# Patient Record
Sex: Male | Born: 1965 | Race: Black or African American | Hispanic: No | Marital: Married | Smoking: Never smoker
Health system: Southern US, Community
[De-identification: ages and names within clinical notes are randomized; demographics above are authoritative.]

## PROBLEM LIST (undated history)

## (undated) DIAGNOSIS — I10 Essential (primary) hypertension: Secondary | ICD-10-CM

## (undated) DIAGNOSIS — E119 Type 2 diabetes mellitus without complications: Secondary | ICD-10-CM

## (undated) DIAGNOSIS — M109 Gout, unspecified: Secondary | ICD-10-CM

---

## 2006-05-26 ENCOUNTER — Emergency Department: Payer: Self-pay | Admitting: Emergency Medicine

## 2009-10-31 ENCOUNTER — Ambulatory Visit: Payer: Self-pay | Admitting: Gastroenterology

## 2009-12-03 ENCOUNTER — Observation Stay: Payer: Self-pay | Admitting: Internal Medicine

## 2010-11-23 ENCOUNTER — Emergency Department: Payer: Self-pay | Admitting: Emergency Medicine

## 2011-01-10 ENCOUNTER — Emergency Department: Payer: Self-pay | Admitting: Emergency Medicine

## 2011-07-10 ENCOUNTER — Emergency Department: Payer: Self-pay | Admitting: Unknown Physician Specialty

## 2011-07-10 LAB — COMPREHENSIVE METABOLIC PANEL
Albumin: 3.5 g/dL (ref 3.4–5.0)
BUN: 12 mg/dL (ref 7–18)
Bilirubin,Total: 0.3 mg/dL (ref 0.2–1.0)
Calcium, Total: 9.3 mg/dL (ref 8.5–10.1)
Co2: 26 mmol/L (ref 21–32)
EGFR (African American): >60
Glucose: 211 mg/dL — ABNORMAL HIGH (ref 65–99)
Osmolality: 282 (ref 275–301)
Potassium: 3.8 mmol/L (ref 3.5–5.1)
SGOT(AST): 28 U/L (ref 15–37)
SGPT (ALT): 28 U/L
Sodium: 138 mmol/L (ref 136–145)
Total Protein: 8.3 g/dL — ABNORMAL HIGH (ref 6.4–8.2)

## 2011-07-10 LAB — SEDIMENTATION RATE: Erythrocyte Sed Rate: 44 mm/hr — ABNORMAL HIGH (ref 0–15)

## 2011-07-10 LAB — CBC
MCH: 29.5 pg (ref 26.0–34.0)
MCHC: 34 g/dL (ref 32.0–36.0)
MCV: 87 fL (ref 80–100)
Platelet: 255 10*3/uL (ref 150–440)
WBC: 5.9 10*3/uL (ref 3.8–10.6)

## 2011-09-05 ENCOUNTER — Ambulatory Visit: Payer: Self-pay

## 2015-07-07 ENCOUNTER — Encounter: Payer: Self-pay | Admitting: Emergency Medicine

## 2015-07-07 ENCOUNTER — Emergency Department
Admission: EM | Admit: 2015-07-07 | Discharge: 2015-07-07 | Disposition: A | Discharge status: Home / Self Care | Payer: 59 | Attending: Emergency Medicine | Admitting: Emergency Medicine

## 2015-07-07 ENCOUNTER — Emergency Department: Payer: 59

## 2015-07-07 DIAGNOSIS — R42 Dizziness and giddiness: Secondary | ICD-10-CM | POA: Insufficient documentation

## 2015-07-07 DIAGNOSIS — Z7982 Long term (current) use of aspirin: Secondary | ICD-10-CM | POA: Diagnosis not present

## 2015-07-07 DIAGNOSIS — E119 Type 2 diabetes mellitus without complications: Secondary | ICD-10-CM | POA: Diagnosis not present

## 2015-07-07 DIAGNOSIS — I10 Essential (primary) hypertension: Secondary | ICD-10-CM | POA: Diagnosis not present

## 2015-07-07 DIAGNOSIS — Z79899 Other long term (current) drug therapy: Secondary | ICD-10-CM | POA: Insufficient documentation

## 2015-07-07 HISTORY — DX: Essential (primary) hypertension: I10

## 2015-07-07 HISTORY — DX: Gout, unspecified: M10.9

## 2015-07-07 HISTORY — DX: Type 2 diabetes mellitus without complications: E11.9

## 2015-07-07 LAB — URINALYSIS COMPLETE WITH MICROSCOPIC (ARMC ONLY)
Bacteria, UA: NONE SEEN
Bilirubin Urine: NEGATIVE
GLUCOSE, UA: NEGATIVE mg/dL
Hgb urine dipstick: NEGATIVE
KETONES UR: NEGATIVE mg/dL
Leukocytes, UA: NEGATIVE
NITRITE: NEGATIVE
PROTEIN: NEGATIVE mg/dL
RBC / HPF: NONE SEEN RBC/hpf (ref 0–5)
SPECIFIC GRAVITY, URINE: 1.01 (ref 1.005–1.030)
Squamous Epithelial / LPF: NONE SEEN
pH: 7 (ref 5.0–8.0)

## 2015-07-07 LAB — CBC
HCT: 36.5 % — ABNORMAL LOW (ref 40.0–52.0)
HEMOGLOBIN: 12.8 g/dL — AB (ref 13.0–18.0)
MCH: 29.8 pg (ref 26.0–34.0)
MCHC: 35 g/dL (ref 32.0–36.0)
MCV: 84.9 fL (ref 80.0–100.0)
Platelets: 272 10*3/uL (ref 150–440)
RBC: 4.3 MIL/uL — AB (ref 4.40–5.90)
RDW: 14.6 % — ABNORMAL HIGH (ref 11.5–14.5)
WBC: 6.8 10*3/uL (ref 3.8–10.6)

## 2015-07-07 LAB — BASIC METABOLIC PANEL
ANION GAP: 11 (ref 5–15)
BUN: 14 mg/dL (ref 6–20)
CHLORIDE: 101 mmol/L (ref 101–111)
CO2: 25 mmol/L (ref 22–32)
CREATININE: 0.85 mg/dL (ref 0.61–1.24)
Calcium: 9.2 mg/dL (ref 8.9–10.3)
GFR calc non Af Amer: >60 mL/min (ref >60)
Glucose, Bld: 147 mg/dL — ABNORMAL HIGH (ref 65–99)
Potassium: 3.6 mmol/L (ref 3.5–5.1)
SODIUM: 137 mmol/L (ref 135–145)

## 2015-07-07 LAB — TROPONIN I: Troponin I: <0.03 ng/mL (ref <0.031)

## 2015-07-07 MED ORDER — SODIUM CHLORIDE 0.9 % IV BOLUS (SEPSIS)
1000.0000 mL | Freq: Once | INTRAVENOUS | Status: AC
  Administered 2015-07-07: 1000 mL via INTRAVENOUS

## 2015-07-07 MED ORDER — MECLIZINE HCL 25 MG PO TABS: 25.0000 mg | ORAL_TABLET | Freq: Three times a day (TID) | ORAL | Status: DC | PRN

## 2015-07-07 NOTE — ED Notes (Signed)
Resumed care from Judson Roch, [place]. Pt resting comfortably with spouse at bedside. NAD noted.

## 2015-07-07 NOTE — ED Notes (Signed)
Pt discharged home after verbalizing understanding of discharge instructions; nad noted. 

## 2015-07-07 NOTE — ED Notes (Addendum)
Pt comes in c/o dizziness, reports that this started yesterday morning. Reports that he got up to get ready for work, went into the bathroom to have a BM, had a normal BM without having to strain a lot, then got up and felt like the room was spinning around him, was stumbling, then went back to bed for a few hours and got up and felt better. This morning he got up and again felt like the world was spinning around him and came in due to these symptoms. Denies any N/V/D, denies chest pain, SOB, headache, blurred vision, abd pain. Denies any recent illness or any recent seasonal allergy problems.

## 2015-07-07 NOTE — Discharge Instructions (Signed)

## 2015-07-07 NOTE — ED Notes (Signed)
Pt given urinal and told that we need a urine sample when he has to pee. Pt verbalized understanding.

## 2015-07-07 NOTE — ED Provider Notes (Signed)
[facility] Emergency Department Provider Note   ____________________________________________  Time seen: Approximately 6:04 AM  I have reviewed the triage vital signs and the nursing notes.   HISTORY  Chief Complaint Dizziness    HPI Steven Meyers is a 50 y.o. male who comes into the hospital today with dizziness. The patient reports that he feels as though the room is spinning. He has been losing his balance as well with the dizziness. The patient reports that the symptoms started yesterday morning around 5:30. The patient was waking up to get ready for work and started feeling the room get dizzy. He denies any headache or blurred vision. He was laid back in the bed for a couple of hours and reports he woke back up and continued about his day. The patient did not have any more episodes of the symptoms during the day yesterday. He reports that when he woke up this morning he went to the bathroom and was doing his typical routine when the dizziness returned. He again denies any headache but reports that his symptoms are improved at this time. The patient had no chest pain or shortness of breath no abdominal pain no nausea or vomiting no weakness or numbness. He is here for evaluation of his dizziness.   Past Medical History  Diagnosis Date  . Diabetes mellitus without complication ([place])   . Hypertension   . Gout     There are no active problems to display for this patient.   History reviewed. No pertinent past surgical history.  Current Outpatient Rx  Name  Route  Sig  Dispense  Refill  . amLODipine (NORVASC) 10 MG tablet   Oral   Take 10 mg by mouth daily.         . colchicine 0.6 MG tablet   Oral   Take 0.6 mg by mouth daily as needed.         . fenofibrate (TRICOR) 48 MG tablet   Oral   Take by mouth daily.         Marland Kitchen glimepiride (AMARYL) 2 MG tablet   Oral   Take 2 mg by mouth daily with breakfast.         . hydrALAZINE  (APRESOLINE) 10 MG tablet               . triamterene-hydrochlorothiazide (DYAZIDE) 37.5-25 MG capsule   Oral   Take 1 capsule by mouth daily.           Allergies Ace inhibitors  History reviewed. No pertinent family history.  Social History Social History  Substance Use Topics  . Smoking status: Never Smoker   . Smokeless tobacco: None  . Alcohol Use: No    Review of Systems Constitutional: No fever/chills Eyes: No visual changes. ENT: No sore throat. Cardiovascular: Denies chest pain. Respiratory: Denies shortness of breath. Gastrointestinal: No abdominal pain.  No nausea, no vomiting.  No diarrhea.  No constipation. Genitourinary: Negative for dysuria. Musculoskeletal: Negative for back pain. Skin: Negative for rash. Neurological: dizziness  10-point ROS otherwise negative.  ____________________________________________   PHYSICAL EXAM:  VITAL SIGNS: ED Triage Vitals  Enc Vitals Group     BP 07/07/15 0552 137/97 mmHg     Pulse Rate 07/07/15 0552 106     Resp 07/07/15 0552 20     Temp 07/07/15 0552 97.5 F (36.4 C)     Temp Source 07/07/15 0552 Oral     SpO2 07/07/15 0552 98 %  Weight 07/07/15 0552 210 lb (95.255 kg)     Height 07/07/15 0552 6\' 1"  (1.854 m)     Head Cir --      Peak Flow --      Pain Score --      Pain Loc --      Pain Edu? --      Excl. in [place]? --     Constitutional: Alert and oriented. Well appearing and in no acute distress. Eyes: Conjunctivae are normal. PERRL. EOMI. Head: Atraumatic. Nose: No congestion/rhinnorhea. Mouth/Throat: Mucous membranes are moist.  Oropharynx non-erythematous. Cardiovascular: Normal rate, regular rhythm. Grossly normal heart sounds.  Good peripheral circulation. Respiratory: Normal respiratory effort.  No retractions. Lungs CTAB. Gastrointestinal: Soft and nontender. No distention. Positive bowel sounds Musculoskeletal: No lower extremity tenderness nor edema.   Neurologic:  Normal speech and  language. No gross focal neurologic deficits are appreciated. Cranial nerves II through XII are grossly intact with no focal motor or neuro deficits Skin:  Skin is warm, dry and intact. Psychiatric: Mood and affect are normal.   ____________________________________________   LABS (all labs ordered are listed, but only abnormal results are displayed)  Labs Reviewed  CBC - Abnormal; Notable for the following:    RBC 4.30 (*)    Hemoglobin 12.8 (*)    HCT 36.5 (*)    RDW 14.6 (*)    All other components within normal limits  BASIC METABOLIC PANEL - Abnormal; Notable for the following:    Glucose, Bld 147 (*)    All other components within normal limits  TROPONIN I  URINALYSIS COMPLETEWITH MICROSCOPIC ([facility] ONLY)   ____________________________________________  EKG  ED ECG REPORT I, Loney Hering, the attending physician, personally viewed and interpreted this ECG.   Date: 07/07/2015  EKG Time: 602  Rate: 93  Rhythm: normal sinus rhythm  Axis: normal  Intervals:none  ST&T Change: none  ____________________________________________  RADIOLOGY  CT head: No acute intracranial abnormality ____________________________________________   PROCEDURES  Procedure(s) performed: None  Critical Care performed: No  ____________________________________________   INITIAL IMPRESSION / ASSESSMENT AND PLAN / ED COURSE  Pertinent labs & imaging results that were available during my care of the patient were reviewed by me and considered in my medical decision making (see chart for details).  This is a 50 year old male who comes into the hospital today with some dizziness. The patient reports that this started suddenly yesterday as he was getting dressed. He reports though that the pain didn't subside but came back. The patient is here for evaluation.  The patient does develop some mild tachycardia with orthostatic vital signs. I will give him a liter of normal saline. The  patient's CT head is also unremarkable. The patient's ears do not show any significant fluid. The patient has not had any viral illness to explain his symptoms. The patient will be signed out to Dr. Corky Downs who will follow-up the results of the head CT and disposition the patient. ____________________________________________   FINAL CLINICAL IMPRESSION(S) / ED DIAGNOSES  Final diagnoses:  Dizziness      NEW MEDICATIONS STARTED DURING THIS VISIT:  New Prescriptions   No medications on file     Note:  This document was prepared using Dragon voice recognition software and may include unintentional dictation errors.    Loney Hering, MD 07/07/15 [phone]

## 2015-07-07 NOTE — ED Notes (Signed)
Pt in with co dizziness since yest no hx of the same. Pt denies any pain or shob or recent illness.

## 2015-07-07 NOTE — ED Notes (Signed)
Pt taken to CT via stretcher by radiology staff

## 2015-07-07 NOTE — ED Notes (Signed)
Pt given meal tray; ok per dr. Corky Downs

## 2015-07-07 NOTE — ED Provider Notes (Signed)
Second troponin is normal. CT head is unremarkable. Patient feels well. 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

## 2016-05-30 ENCOUNTER — Encounter: Payer: Self-pay | Admitting: *Deleted

## 2016-05-30 ENCOUNTER — Emergency Department
Admission: EM | Admit: 2016-05-30 | Discharge: 2016-05-30 | Disposition: A | Discharge status: Home / Self Care | Payer: 59 | Attending: Emergency Medicine | Admitting: Emergency Medicine

## 2016-05-30 DIAGNOSIS — X500XXA Overexertion from strenuous movement or load, initial encounter: Secondary | ICD-10-CM | POA: Insufficient documentation

## 2016-05-30 DIAGNOSIS — Z7982 Long term (current) use of aspirin: Secondary | ICD-10-CM | POA: Insufficient documentation

## 2016-05-30 DIAGNOSIS — Y9389 Activity, other specified: Secondary | ICD-10-CM | POA: Insufficient documentation

## 2016-05-30 DIAGNOSIS — R030 Elevated blood-pressure reading, without diagnosis of hypertension: Secondary | ICD-10-CM

## 2016-05-30 DIAGNOSIS — Z7984 Long term (current) use of oral hypoglycemic drugs: Secondary | ICD-10-CM | POA: Insufficient documentation

## 2016-05-30 DIAGNOSIS — Y999 Unspecified external cause status: Secondary | ICD-10-CM | POA: Insufficient documentation

## 2016-05-30 DIAGNOSIS — I10 Essential (primary) hypertension: Secondary | ICD-10-CM | POA: Insufficient documentation

## 2016-05-30 DIAGNOSIS — E119 Type 2 diabetes mellitus without complications: Secondary | ICD-10-CM | POA: Insufficient documentation

## 2016-05-30 DIAGNOSIS — Y929 Unspecified place or not applicable: Secondary | ICD-10-CM | POA: Insufficient documentation

## 2016-05-30 DIAGNOSIS — S39012A Strain of muscle, fascia and tendon of lower back, initial encounter: Secondary | ICD-10-CM | POA: Insufficient documentation

## 2016-05-30 MED ORDER — DIAZEPAM 5 MG PO TABS
5.0000 mg | ORAL_TABLET | Freq: Once | ORAL | Status: AC
  Administered 2016-05-30: 5 mg via ORAL
  Filled 2016-05-30: qty 1

## 2016-05-30 MED ORDER — KETOROLAC TROMETHAMINE 30 MG/ML IJ SOLN
30.0000 mg | Freq: Once | INTRAMUSCULAR | Status: AC
  Administered 2016-05-30: 30 mg via INTRAMUSCULAR
  Filled 2016-05-30: qty 1

## 2016-05-30 MED ORDER — NAPROXEN 500 MG PO TABS: 500.0000 mg | ORAL_TABLET | Freq: Two times a day (BID) | ORAL | 0 refills | Status: DC

## 2016-05-30 MED ORDER — HYDROCODONE-ACETAMINOPHEN 5-325 MG PO TABS: 1.0000 | ORAL_TABLET | ORAL | 0 refills | Status: DC | PRN

## 2016-05-30 MED ORDER — DIAZEPAM 2 MG PO TABS: 2.0000 mg | ORAL_TABLET | Freq: Three times a day (TID) | ORAL | 0 refills | Status: DC | PRN

## 2016-05-30 MED ORDER — HYDROCODONE-ACETAMINOPHEN 5-325 MG PO TABS
2.0000 | ORAL_TABLET | Freq: Once | ORAL | Status: AC
  Administered 2016-05-30: 2 via ORAL
  Filled 2016-05-30: qty 2

## 2016-05-30 NOTE — ED Triage Notes (Signed)
Pt complains of low back pain starting 2 days, pt reports pain in left buttock, pt denies any other symptoms

## 2016-05-30 NOTE — ED Notes (Signed)
See triage note. Developed pain to left lower back about 3 days ago  States pain is non radiating  Min relief with OTC Aleve   States he developed pain after lifting a bird bath  ambulates with limp and using crutches

## 2016-05-30 NOTE — Discharge Instructions (Signed)
Follow-up with her primary care doctor if any continued problems. Continue taking medication as directed. Valium as needed for muscle spasms 3 times a day and Norco as needed for pain. These medications could cause drowsiness therefore you cannot operate machinery or drive. Naproxen 500 mg twice a day with food. This medication is for inflammation. You can drive while taking naproxen. Use ice or heat to your back as needed for comfort.

## 2016-05-30 NOTE — ED Provider Notes (Signed)
[facility] Emergency Department Provider Note   ____________________________________________   First MD Initiated Contact with Patient 05/30/16 [phone]     (approximate)  I have reviewed the triage vital signs and the nursing notes.   HISTORY  Chief Complaint Back Pain    HPI Steven Meyers is a 51 y.o. male is here with complaint of pain left buttocks.  Patient states approximately 2 days ago he lifted a bird bath and began having pain immediately. He's been taking Aleve over-the-counter with some relief of his pain. Patient states that the pain is nonradiating. He denies any urinary symptoms. Patient has been using crutches to help support him while walking and continues to have a limp due to his pain. Patient denies any prior back problems. 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 Currently he rates his pain as 8 out of 10. His last Aleve was last night.   Past Medical History:  Diagnosis Date  . Diabetes mellitus without complication ([place])   . Gout   . Hypertension     There are no active problems to display for this patient.   History reviewed. No pertinent surgical history.  Prior to Admission medications   Medication Sig Start Date End Date Taking? Authorizing Provider  amLODipine (NORVASC) 10 MG tablet Take 10 mg by mouth daily.    Historical Provider, MD  aspirin 81 MG chewable tablet Chew 81 mg by mouth daily.    Historical Provider, MD  colchicine 0.6 MG tablet Take 0.6 mg by mouth daily as needed.    Historical Provider, MD  diazepam (VALIUM) 2 MG tablet Take 1 tablet (2 mg total) by mouth every 8 (eight) hours as needed for muscle spasms. 05/30/16   Johnn Hai, PA-C  glimepiride (AMARYL) 2 MG tablet Take 2 mg by mouth daily with breakfast.    Historical Provider, MD  hydrALAZINE (APRESOLINE) 10 MG tablet Take 10 mg by mouth 2 (two) times daily.     Historical Provider, MD    HYDROcodone-acetaminophen (NORCO/VICODIN) 5-325 MG tablet Take 1 tablet by mouth every 4 (four) hours as needed for moderate pain. 05/30/16   Johnn Hai, PA-C  naproxen (NAPROSYN) 500 MG tablet Take 1 tablet (500 mg total) by mouth 2 (two) times daily with a meal. 05/30/16   Johnn Hai, PA-C  triamterene-hydrochlorothiazide (DYAZIDE) 37.5-25 MG capsule Take 1 capsule by mouth daily.    Historical Provider, MD    Allergies Ace inhibitors  No family history on file.  Social History Social History  Substance Use Topics  . Smoking status: Never Smoker  . Smokeless tobacco: Not on file  . Alcohol use No    Review of Systems  Constitutional: No fever/chills Cardiovascular: Denies chest pain. Respiratory: Denies shortness of breath. Gastrointestinal: No abdominal pain.  No nausea, no vomiting.  Genitourinary: Negative for dysuria. Musculoskeletal: Positive for back pain. Positive left buttocks pain. Neurological: Negative for headaches, focal weakness or numbness.   ____________________________________________   PHYSICAL EXAM:  VITAL SIGNS: ED Triage Vitals  Enc Vitals Group     BP 05/30/16 0730 (!) 162/103     Pulse Rate 05/30/16 0730 100     Resp 05/30/16 0730 16     Temp 05/30/16 0730 97.8 F (36.6 C)     Temp Source 05/30/16 0730 Oral     SpO2 05/30/16 0730 99 %     Weight 05/30/16 0731 215 lb (97.5 kg)  Height 05/30/16 0731 6\' 2"  (1.88 m)     Head Circumference --      Peak Flow --      Pain Score 05/30/16 0724 8     Pain Loc --      Pain Edu? --      Excl. in [place]? --     Constitutional: Alert and oriented. Well appearing and in no acute distress. Eyes: Conjunctivae are normal. PERRL. EOMI. Head: Atraumatic. Nose: No congestion/rhinnorhea. Neck: No stridor.   Cardiovascular: Normal rate, regular rhythm. Grossly normal heart sounds.  Good peripheral circulation. Respiratory: Normal respiratory effort.  No retractions. Lungs  CTAB. Gastrointestinal: Soft and nontender. No distention.  No CVA tenderness. Musculoskeletal: Examination back there is no gross deformity noted. There is tenderness on palpation of the left sacral area along with the left lumbar paravertebral muscles. Range of motion is restricted secondary to discomfort. No active muscle spasms were seen. There is moderate soft tissue tenderness on palpation. Straight leg raises were negative at 90. Neurologic:  Normal speech and language. No gross focal neurologic deficits are appreciated.  Skin:  Skin is warm, dry and intact. No rash noted. Psychiatric: Mood and affect are normal. Speech and behavior are normal.  ____________________________________________   LABS (all labs ordered are listed, but only abnormal results are displayed)  Labs Reviewed - No data to display _  PROCEDURES  Procedure(s) performed: None  Procedures  Critical Care performed: No  ____________________________________________   INITIAL IMPRESSION / ASSESSMENT AND PLAN / ED COURSE  Pertinent labs & imaging results that were available during my care of the patient were reviewed by me and considered in my medical decision making (see chart for details).  Patient improved with medication given in the department. Patient was given Norco 2 tablets by mouth along with Valium 5 mg. He was also given Toradol 30 mg injection. Patient is able to move with less pain and states that he feels much better. We discussed continuing his medications for the next several days. He is also encouraged to use ice or heat to his back. Patient will follow up with his PCP at [facility] if any continued problems and also for recheck of his blood pressure.      ____________________________________________   FINAL CLINICAL IMPRESSION(S) / ED DIAGNOSES  Final diagnoses:  Strain of lumbar paraspinal muscle, initial encounter  Elevated blood pressure reading      NEW MEDICATIONS  STARTED DURING THIS VISIT:  Discharge Medication List as of 05/30/2016  9:07 AM    START taking these medications   Details  diazepam (VALIUM) 2 MG tablet Take 1 tablet (2 mg total) by mouth every 8 (eight) hours as needed for muscle spasms., Starting Wed 05/30/2016, Print    HYDROcodone-acetaminophen (NORCO/VICODIN) 5-325 MG tablet Take 1 tablet by mouth every 4 (four) hours as needed for moderate pain., Starting Wed 05/30/2016, Print    naproxen (NAPROSYN) 500 MG tablet Take 1 tablet (500 mg total) by mouth 2 (two) times daily with a meal., Starting Wed 05/30/2016, Print         Note:  This document was prepared using Dragon voice recognition software and may include unintentional dictation errors.    Johnn Hai, PA-C 05/30/16 [place], MD 06/04/16 [phone]

## 2016-05-30 NOTE — ED Notes (Signed)
Pt given ice water. Ok'ed by Suanne Marker, [place]

## 2016-10-27 ENCOUNTER — Emergency Department
Admission: EM | Admit: 2016-10-27 | Discharge: 2016-10-27 | Disposition: A | Discharge status: Home / Self Care | Payer: 59 | Attending: Emergency Medicine | Admitting: Emergency Medicine

## 2016-10-27 ENCOUNTER — Encounter: Payer: Self-pay | Admitting: Medical Oncology

## 2016-10-27 ENCOUNTER — Emergency Department: Payer: 59

## 2016-10-27 DIAGNOSIS — Z7982 Long term (current) use of aspirin: Secondary | ICD-10-CM | POA: Insufficient documentation

## 2016-10-27 DIAGNOSIS — Z79899 Other long term (current) drug therapy: Secondary | ICD-10-CM | POA: Insufficient documentation

## 2016-10-27 DIAGNOSIS — M25461 Effusion, right knee: Secondary | ICD-10-CM | POA: Insufficient documentation

## 2016-10-27 DIAGNOSIS — I10 Essential (primary) hypertension: Secondary | ICD-10-CM | POA: Insufficient documentation

## 2016-10-27 DIAGNOSIS — M1711 Unilateral primary osteoarthritis, right knee: Secondary | ICD-10-CM

## 2016-10-27 MED ORDER — KETOROLAC TROMETHAMINE 30 MG/ML IJ SOLN
30.0000 mg | Freq: Once | INTRAMUSCULAR | Status: AC
  Administered 2016-10-27: 30 mg via INTRAVENOUS
  Filled 2016-10-27: qty 1

## 2016-10-27 MED ORDER — HYDROCODONE-ACETAMINOPHEN 5-325 MG PO TABS: 1.0000 | ORAL_TABLET | Freq: Four times a day (QID) | ORAL | 0 refills | Status: DC | PRN

## 2016-10-27 MED ORDER — NAPROXEN 500 MG PO TABS: 500.0000 mg | ORAL_TABLET | Freq: Two times a day (BID) | ORAL | 0 refills | Status: AC

## 2016-10-27 NOTE — ED Triage Notes (Signed)
Rt knee pain with hx of same, had drainage removed from knee 5 months ago. No injury.

## 2016-10-27 NOTE — ED Provider Notes (Signed)
[facility] Emergency Department Provider Note  ____________________________________________  Time seen: Approximately 11:19 AM  I have reviewed the triage vital signs and the nursing notes.   HISTORY  Chief Complaint Knee Pain    HPI Steven Meyers is a 51 y.o. male that presents to the emergency department for evaluation of right knee pain on and off for months. Knee is painful to move but is not red or hot to touch. No trauma. Current pain began about 3 days ago. He saw his primary care provider, who prescribed him prednisone. He went to urgent care this morning and was told to come to the emergency room to have fluid drained off of his knee. He has had fluid drained off his knee before.He has a history of gout and takes colchicine. No fever, shortness breath, chest pain, nausea, vomiting, abdominal pain, calf pain, numbness, tingling.   Past Medical History:  Diagnosis Date  . Diabetes mellitus without complication ([place])   . Gout   . Hypertension     There are no active problems to display for this patient.   No past surgical history on file.  Prior to Admission medications   Medication Sig Start Date End Date Taking? Authorizing Provider  amLODipine (NORVASC) 10 MG tablet Take 10 mg by mouth daily.    [provider]  aspirin 81 MG chewable tablet Chew 81 mg by mouth daily.    [provider]  colchicine 0.6 MG tablet Take 0.6 mg by mouth daily as needed.    [provider]  diazepam (VALIUM) 2 MG tablet Take 1 tablet (2 mg total) by mouth every 8 (eight) hours as needed for muscle spasms. 05/30/16   Johnn Hai, PA-C  glimepiride (AMARYL) 2 MG tablet Take 2 mg by mouth daily with breakfast.    [provider]  hydrALAZINE (APRESOLINE) 10 MG tablet Take 10 mg by mouth 2 (two) times daily.     [provider]  HYDROcodone-acetaminophen (NORCO/VICODIN) 5-325 MG tablet Take 1 tablet by mouth every  6 (six) hours as needed for moderate pain. 10/27/16   Laban Emperor, PA-C  naproxen (NAPROSYN) 500 MG tablet Take 1 tablet (500 mg total) by mouth 2 (two) times daily with a meal. 10/27/16 10/27/17  Laban Emperor, PA-C  triamterene-hydrochlorothiazide (DYAZIDE) 37.5-25 MG capsule Take 1 capsule by mouth daily.    [provider]    Allergies Ace inhibitors  No family history on file.  Social History Social History  Substance Use Topics  . Smoking status: Never Smoker  . Smokeless tobacco: Not on file  . Alcohol use No     Review of Systems  Constitutional: No fever/chills Cardiovascular: No chest pain. Respiratory: No SOB. Gastrointestinal: No abdominal pain.  No nausea, no vomiting.  Musculoskeletal: Positive for knee pain. Skin: Negative for rash, abrasions, lacerations, ecchymosis. Neurological: Negative for numbness or tingling   ____________________________________________   PHYSICAL EXAM:  VITAL SIGNS: ED Triage Vitals  Enc Vitals Group     BP 10/27/16 1044 (!) 145/99     Pulse Rate 10/27/16 1044 67     Resp 10/27/16 1044 18     Temp 10/27/16 1044 (!) 97.3 F (36.3 C)     Temp Source 10/27/16 1044 Oral     SpO2 10/27/16 1044 99 %     Weight 10/27/16 1045 210 lb (95.3 kg)     Height 10/27/16 1045 6\' 2"  (1.88 m)     Head Circumference --  Peak Flow --      Pain Score 10/27/16 1044 8     Pain Loc --      Pain Edu? --      Excl. in [place]? --      Constitutional: Alert and oriented. Well appearing and in no acute distress. Eyes: Conjunctivae are normal. PERRL. EOMI. Head: Atraumatic. ENT:      Ears:      Nose: No congestion/rhinnorhea.      Mouth/Throat: Mucous membranes are moist.  Neck: No stridor.  Cardiovascular: Normal rate, regular rhythm.  Good peripheral circulation. Palpable dorsalis pedis pulses. Respiratory: Normal respiratory effort without tachypnea or retractions. Lungs CTAB. Good air entry to the bases with no decreased or  absent breath sounds. Musculoskeletal: Full range of motion to all extremities. No gross deformities appreciated. Tenderness to palpation in the popliteal fossa. No calf tenderness to palpation. No erythema or visible swelling. Neurologic:  Normal speech and language. No gross focal neurologic deficits are appreciated.  Skin:  Skin is warm, dry and intact. No rash noted.   ____________________________________________   LABS (all labs ordered are listed, but only abnormal results are displayed)  Labs Reviewed - No data to display ____________________________________________  EKG   ____________________________________________  RADIOLOGY Robinette Haines, personally viewed and evaluated these images (plain radiographs) as part of my medical decision making, as well as reviewing the written report by the radiologist.  Dg Knee Complete 4 Views Right  Result Date: 10/27/2016 CLINICAL DATA:  Diffuse right knee pain for 1 week, worse today. EXAM: RIGHT KNEE - COMPLETE 4+ VIEW COMPARISON:  None. FINDINGS: Spurring along the medial knee compartment. There may be a small amount of suprapatellar joint fluid. Mild degenerative changes at patellofemoral compartment. Negative for fracture or dislocation. IMPRESSION: No acute abnormality to the right knee. Mild degenerative changes.  Question a small amount of joint fluid. Electronically Signed   By: Markus Daft M.D.   On: 10/27/2016 11:18    ____________________________________________    PROCEDURES  Procedure(s) performed:    Procedures    Medications  ketorolac (TORADOL) 30 MG/ML injection 30 mg (30 mg Intravenous Given 10/27/16 1221)     ____________________________________________   INITIAL IMPRESSION / ASSESSMENT AND PLAN / ED COURSE  Pertinent labs & imaging results that were available during my care of the patient were reviewed by me and considered in my medical decision making (see chart for details).  Review of the [place] CSRS  was performed in accordance of the [place] prior to dispensing any controlled drugs.    Patient presented to emergency department for evaluation of knee pain on and off for months. X-ray consistent with osteoarthritis and a possible small amount of joint fluid. Toradol shot was given. Patient is currently taking prednisone. Patient will be discharged home with prescriptions for a short course of Vicodin and naproxen. Patient is to follow up with orthopedics as directed. Patient is given ED precautions to return to the ED for any worsening or new symptoms.     ____________________________________________  FINAL CLINICAL IMPRESSION(S) / ED DIAGNOSES  Final diagnoses:  Osteoarthritis of right knee, unspecified osteoarthritis type  Fluid in right knee joint      NEW MEDICATIONS STARTED DURING THIS VISIT:  Discharge Medication List as of 10/27/2016 12:32 PM          This chart was dictated using voice recognition software/Dragon. Despite best efforts to proofread, errors can occur which can change the meaning. Any change was purely unintentional.  Laban Emperor, PA-C 10/27/16 1642    Lisa Roca, MD 10/28/16 [phone]

## 2017-01-31 ENCOUNTER — Encounter: Payer: Self-pay | Admitting: Emergency Medicine

## 2017-01-31 ENCOUNTER — Emergency Department
Admission: EM | Admit: 2017-01-31 | Discharge: 2017-01-31 | Disposition: A | Discharge status: Home / Self Care | Payer: 59 | Attending: Emergency Medicine | Admitting: Emergency Medicine

## 2017-01-31 ENCOUNTER — Other Ambulatory Visit: Payer: Self-pay

## 2017-01-31 DIAGNOSIS — E119 Type 2 diabetes mellitus without complications: Secondary | ICD-10-CM | POA: Insufficient documentation

## 2017-01-31 DIAGNOSIS — K122 Cellulitis and abscess of mouth: Secondary | ICD-10-CM | POA: Insufficient documentation

## 2017-01-31 DIAGNOSIS — Z7982 Long term (current) use of aspirin: Secondary | ICD-10-CM | POA: Insufficient documentation

## 2017-01-31 DIAGNOSIS — Z79899 Other long term (current) drug therapy: Secondary | ICD-10-CM | POA: Insufficient documentation

## 2017-01-31 LAB — GROUP A STREP BY PCR: GROUP A STREP BY PCR: DETECTED — AB

## 2017-01-31 MED ORDER — METHYLPREDNISOLONE 4 MG PO TBPK: ORAL_TABLET | ORAL | 0 refills | Status: DC

## 2017-01-31 MED ORDER — AMOXICILLIN 875 MG PO TABS: 875.0000 mg | ORAL_TABLET | Freq: Two times a day (BID) | ORAL | 0 refills | Status: DC

## 2017-01-31 MED ORDER — MAGIC MOUTHWASH W/LIDOCAINE: 5.0000 mL | Freq: Four times a day (QID) | ORAL | 0 refills | Status: DC

## 2017-01-31 NOTE — ED Provider Notes (Signed)
[facility] Emergency Department Provider Note   ____________________________________________   First MD Initiated Contact with Patient 01/31/17 0719     (approximate)  I have reviewed the triage vital signs and the nursing notes.   HISTORY  Chief Complaint Sore Throat    HPI Steven Meyers is a 51 y.o. male patient complaining of sore throat that began last night. Patient awakened this morning with over-the-counter swollen. Patient denies URI signs and symptoms. Patient is able to tolerate fluids. Patient did not try solids since onset of complaint.Patient rates his pain as 6/10. Patient described his pain as "sore". No palliative measures for complaint   Past Medical History:  Diagnosis Date  . Diabetes mellitus without complication ([place])   . Gout   . Hypertension     There are no active problems to display for this patient.   History reviewed. No pertinent surgical history.  Prior to Admission medications   Medication Sig Start Date End Date Taking? Authorizing Provider  amLODipine (NORVASC) 10 MG tablet Take 10 mg by mouth daily.    [provider]  amoxicillin (AMOXIL) 875 MG tablet Take 1 tablet (875 mg total) by mouth 2 (two) times daily. 01/31/17   Sable Feil, PA-C  aspirin 81 MG chewable tablet Chew 81 mg by mouth daily.    [provider]  colchicine 0.6 MG tablet Take 0.6 mg by mouth daily as needed.    [provider]  diazepam (VALIUM) 2 MG tablet Take 1 tablet (2 mg total) by mouth every 8 (eight) hours as needed for muscle spasms. 05/30/16   Johnn Hai, PA-C  glimepiride (AMARYL) 2 MG tablet Take 2 mg by mouth daily with breakfast.    [provider]  hydrALAZINE (APRESOLINE) 10 MG tablet Take 10 mg by mouth 2 (two) times daily.     [provider]  HYDROcodone-acetaminophen (NORCO/VICODIN) 5-325 MG tablet Take 1 tablet by mouth every 6 (six) hours as needed for moderate  pain. 10/27/16   Laban Emperor, PA-C  magic mouthwash w/lidocaine SOLN Take 5 mLs by mouth 4 (four) times daily. For swish and swallow 01/31/17   Sable Feil, PA-C  methylPREDNISolone (MEDROL DOSEPAK) 4 MG TBPK tablet Take Tapered dose as directed 01/31/17   Sable Feil, PA-C  naproxen (NAPROSYN) 500 MG tablet Take 1 tablet (500 mg total) by mouth 2 (two) times daily with a meal. 10/27/16 10/27/17  Laban Emperor, PA-C  triamterene-hydrochlorothiazide (DYAZIDE) 37.5-25 MG capsule Take 1 capsule by mouth daily.    [provider]    Allergies Ace inhibitors  No family history on file.  Social History Social History   Tobacco Use  . Smoking status: Never Smoker  Substance Use Topics  . Alcohol use: No  . Drug use: No    Review of Systems  Constitutional: No fever/chills Eyes: No visual changes. ENT: Sore throat Cardiovascular: Denies chest pain. Respiratory: Denies shortness of breath. Gastrointestinal: No abdominal pain.  No nausea, no vomiting.  No diarrhea.  No constipation. Genitourinary: Negative for dysuria. Musculoskeletal: Negative for back pain. Skin: Negative for rash. Neurological: Negative for headaches, focal weakness or numbness. Endocrine:Diabetes, gout, and hypertension Allergic/Immunilogical: Ace inhibitors ____________________________________________   PHYSICAL EXAM:  VITAL SIGNS: ED Triage Vitals  Enc Vitals Group     BP 01/31/17 0706 (!) 139/92     Pulse Rate 01/31/17 0706 (!) 111     Resp 01/31/17 0706 20     Temp 01/31/17  0706 98.5 F (36.9 C)     Temp Source 01/31/17 0706 Oral     SpO2 01/31/17 0706 97 %     Weight 01/31/17 0706 215 lb (97.5 kg)     Height 01/31/17 0706 6\' 2"  (1.88 m)     Head Circumference --      Peak Flow --      Pain Score 01/31/17 0708 6     Pain Loc --      Pain Edu? --      Excl. in [place]? --     Constitutional: Alert and oriented. Well appearing and in no acute distress. Eyes: Conjunctivae are  normal. PERRL. EOMI. Head: Atraumatic. Nose: No congestion/rhinnorhea. Mouth/Throat: Mucous membranes are moist.  Oropharynx non-erythematous. Edematous Uvula. Neck: No stridor.   Hematological/Lymphatic/Immunilogical: No cervical lymphadenopathy. Cardiovascular: Tachycardic regular rhythm. Grossly normal heart sounds.  Good peripheral circulation. Respiratory: Normal respiratory effort.  No retractions. Lungs CTAB. Gastrointestinal: Soft and nontender. No  Neurologic:  Normal speech and language. No gross focal neurologic deficits are appreciated. No gait instability. Skin:  Skin is warm, dry and intact. No rash noted. Psychiatric: Mood and affect are normal. Speech and behavior are normal.  ____________________________________________   LABS (all labs ordered are listed, but only abnormal results are displayed)  Labs Reviewed  GROUP A STREP BY PCR - Abnormal; Notable for the following components:      Result Value   Group A Strep by PCR DETECTED (*)    All other components within normal limits   ____________________________________________  EKG   ____________________________________________  RADIOLOGY  No results found.  ____________________________________________   PROCEDURES  Procedure(s) performed: None  Procedures  Critical Care performed: No  ____________________________________________   INITIAL IMPRESSION / ASSESSMENT AND PLAN / ED COURSE  As part of my medical decision making, I reviewed the following data within the electronic MEDICAL RECORD NUMBER    Uvulitis secondary to strep. Patient given discharge care instructions. Patient advised take medication as directed. Patient advised follow-up PCP if no improvement in 2-3 days. Patient given a work excuse.    ____________________________________________   FINAL CLINICAL IMPRESSION(S) / ED DIAGNOSES  Final diagnoses:  Uvulitis     ED Discharge Orders        Ordered    amoxicillin (AMOXIL) 875  MG tablet  2 times daily     01/31/17 0802    magic mouthwash w/lidocaine SOLN  4 times daily    Comments:  Mixed 30 mL of Benadryl, 30 mL of nystatin, and 30 mL of viscous lidocaine   01/31/17 0802    methylPREDNISolone (MEDROL DOSEPAK) 4 MG TBPK tablet     01/31/17 0802       Note:  This document was prepared using Dragon voice recognition software and may include unintentional dictation errors.    Sable Feil, PA-C 01/31/17 Charlann Boxer    Lavonia Drafts, MD 01/31/17 [phone]

## 2017-01-31 NOTE — ED Notes (Signed)
See triage note  Presents with sore throat since yesterday  Increased pain with swallowing.swelling noted to back of throat

## 2017-01-31 NOTE — ED Triage Notes (Signed)
Pt reports that his throat is sore and his Uvula is swollen. This all began last night. NAD.

## 2018-02-01 IMAGING — CR DG KNEE COMPLETE 4+V*R*
4 series · 4 of 4 positions shown · non-contrast
Comparison: None.

CLINICAL DATA: Diffuse right knee pain for 1 week, worse today.

EXAM:
RIGHT KNEE - COMPLETE 4+ VIEW

[knee ap]
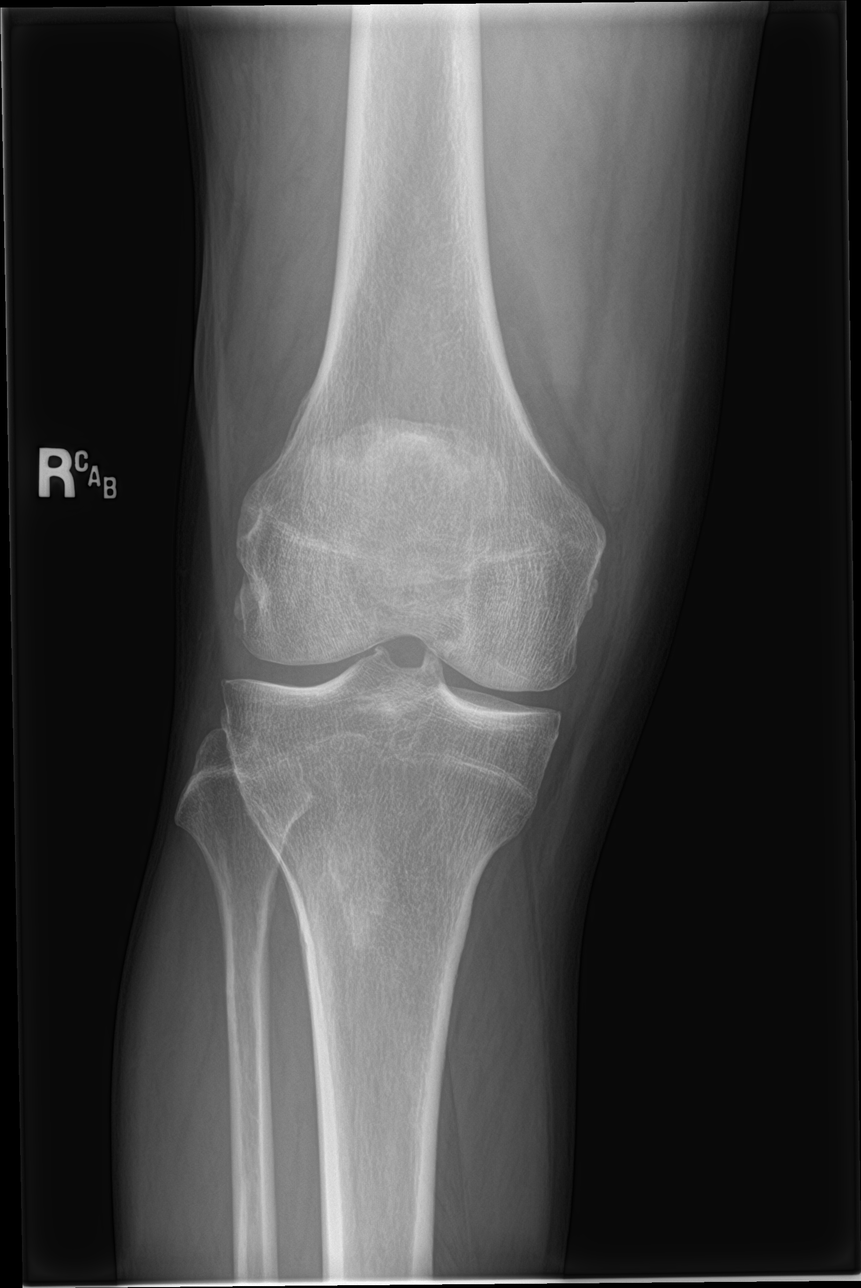

[knee obl (1 of 2)]
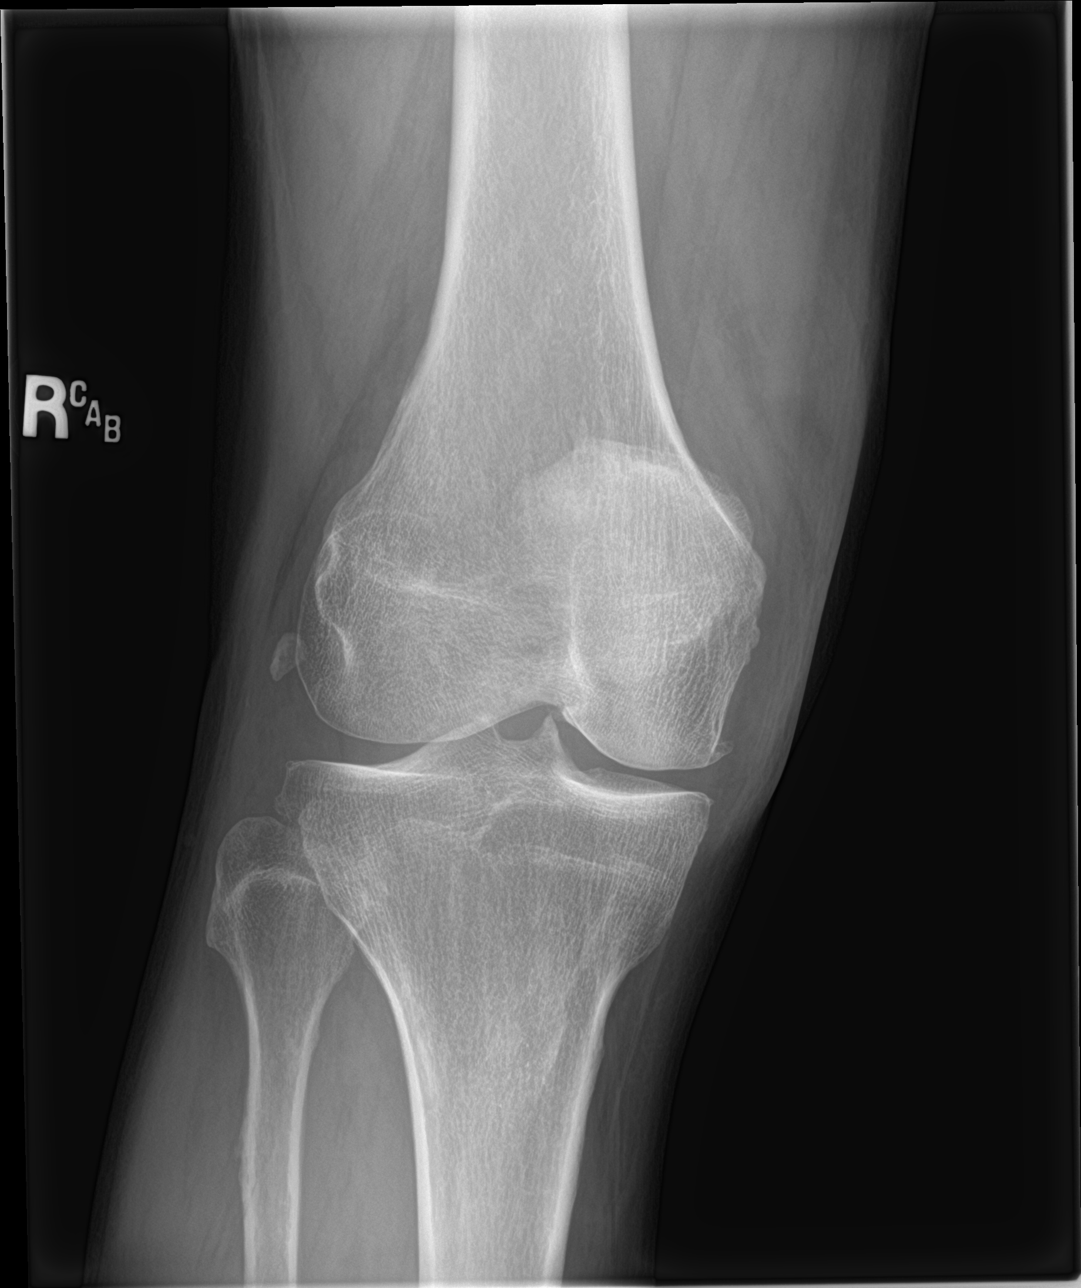

[knee obl (2 of 2)]
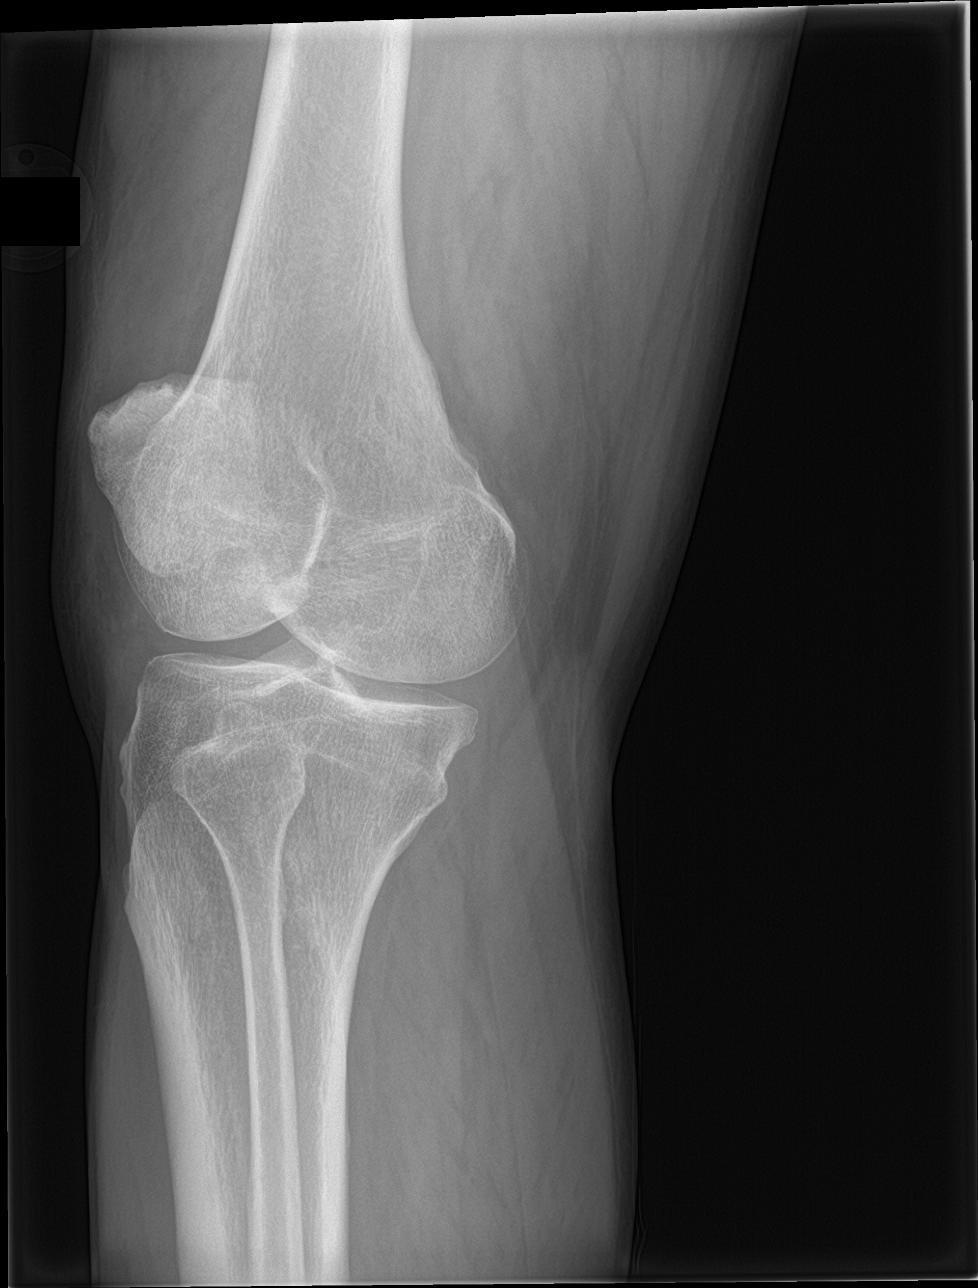

[knee lat]
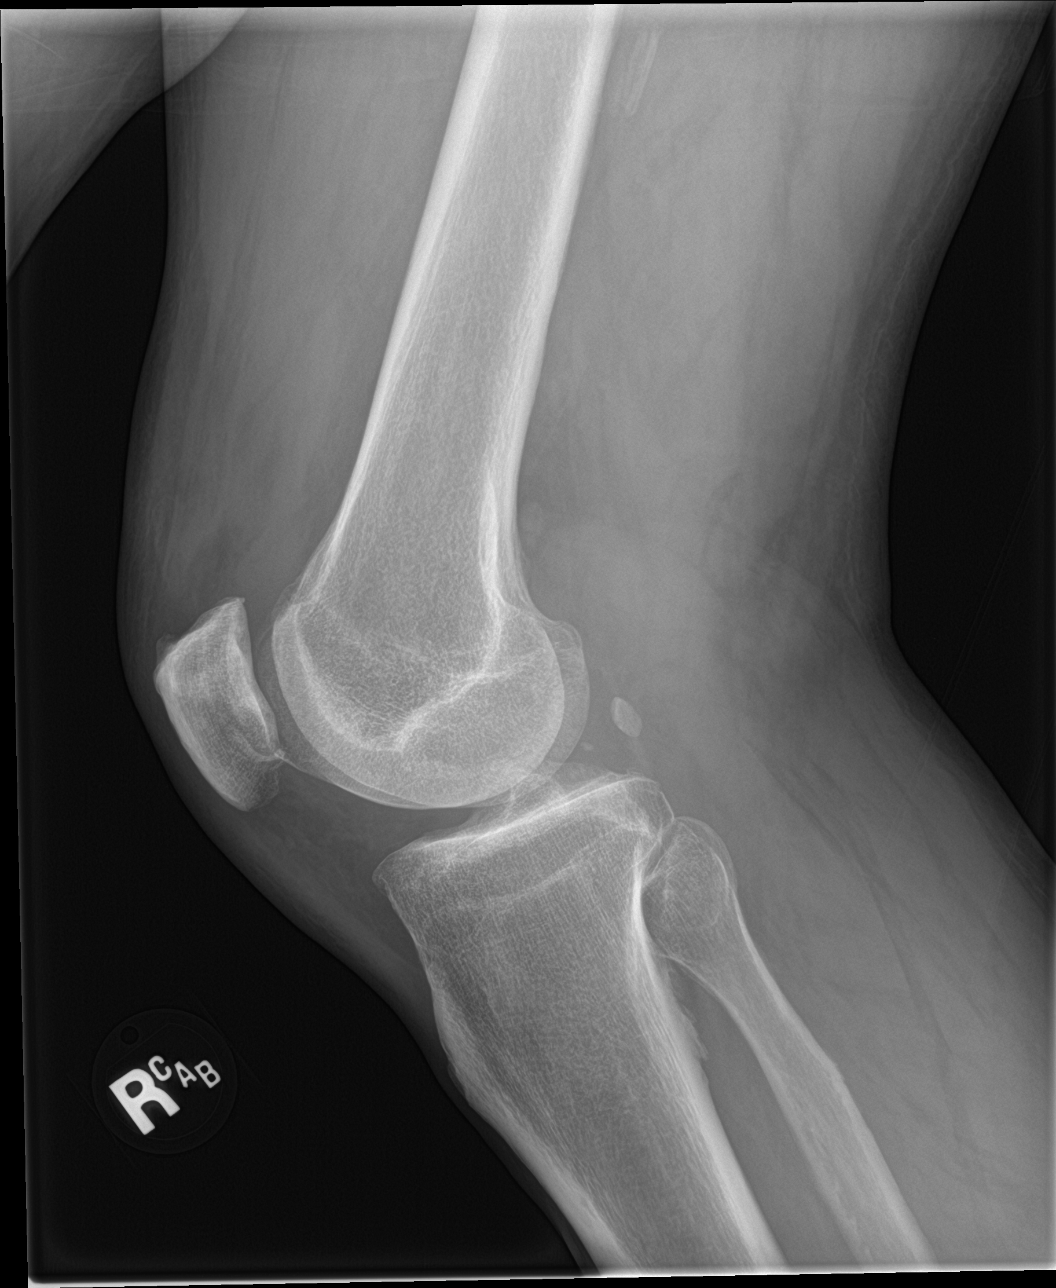

[4 of 4 positions shown; findings below may reference images not displayed]

FINDINGS: Spurring along the medial knee compartment. There may be a small
amount of suprapatellar joint fluid. Mild degenerative changes at
patellofemoral compartment. Negative for fracture or dislocation.
IMPRESSION: No acute abnormality to the right knee.

Mild degenerative changes.  Question a small amount of joint fluid.

## 2018-03-11 ENCOUNTER — Emergency Department: Payer: Medicaid Other

## 2018-03-11 ENCOUNTER — Inpatient Hospital Stay
Admission: EM | Admit: 2018-03-11 | Discharge: 2018-03-13 | DRG: 392 | Disposition: A | Discharge status: Home / Self Care | Payer: Medicaid Other | Attending: Surgery | Admitting: Surgery

## 2018-03-11 ENCOUNTER — Encounter: Payer: Self-pay | Admitting: Emergency Medicine

## 2018-03-11 ENCOUNTER — Other Ambulatory Visit: Payer: Self-pay

## 2018-03-11 DIAGNOSIS — I10 Essential (primary) hypertension: Secondary | ICD-10-CM | POA: Diagnosis present

## 2018-03-11 DIAGNOSIS — K5720 Diverticulitis of large intestine with perforation and abscess without bleeding: Secondary | ICD-10-CM | POA: Diagnosis present

## 2018-03-11 DIAGNOSIS — Z888 Allergy status to other drugs, medicaments and biological substances status: Secondary | ICD-10-CM

## 2018-03-11 DIAGNOSIS — E119 Type 2 diabetes mellitus without complications: Secondary | ICD-10-CM | POA: Diagnosis present

## 2018-03-11 DIAGNOSIS — R109 Unspecified abdominal pain: Secondary | ICD-10-CM | POA: Diagnosis present

## 2018-03-11 DIAGNOSIS — K5732 Diverticulitis of large intestine without perforation or abscess without bleeding: Secondary | ICD-10-CM | POA: Diagnosis present

## 2018-03-11 LAB — COMPREHENSIVE METABOLIC PANEL
ALK PHOS: 60 U/L (ref 38–126)
ALT: 19 U/L (ref 0–44)
ANION GAP: 8 (ref 5–15)
AST: 18 U/L (ref 15–41)
Albumin: 3.8 g/dL (ref 3.5–5.0)
BILIRUBIN TOTAL: 0.9 mg/dL (ref 0.3–1.2)
BUN: 12 mg/dL (ref 6–20)
CALCIUM: 9.1 mg/dL (ref 8.9–10.3)
CO2: 26 mmol/L (ref 22–32)
Chloride: 103 mmol/L (ref 98–111)
Creatinine, Ser: 0.79 mg/dL (ref 0.61–1.24)
GFR calc non Af Amer: >60 mL/min (ref >60)
Glucose, Bld: 187 mg/dL — ABNORMAL HIGH (ref 70–99)
POTASSIUM: 3.4 mmol/L — AB (ref 3.5–5.1)
Sodium: 137 mmol/L (ref 135–145)
TOTAL PROTEIN: 7.9 g/dL (ref 6.5–8.1)

## 2018-03-11 LAB — URINALYSIS, COMPLETE (UACMP) WITH MICROSCOPIC
Bacteria, UA: NONE SEEN
Bilirubin Urine: NEGATIVE
Glucose, UA: >=500 mg/dL — AB
HGB URINE DIPSTICK: NEGATIVE
Ketones, ur: NEGATIVE mg/dL
Leukocytes, UA: NEGATIVE
Nitrite: NEGATIVE
Protein, ur: 30 mg/dL — AB
Specific Gravity, Urine: 1.012 (ref 1.005–1.030)
Squamous Epithelial / HPF: NONE SEEN (ref 0–5)
pH: 5 (ref 5.0–8.0)

## 2018-03-11 LAB — CBC
HCT: 39.8 % (ref 39.0–52.0)
HEMOGLOBIN: 13.7 g/dL (ref 13.0–17.0)
MCH: 28.7 pg (ref 26.0–34.0)
MCHC: 34.4 g/dL (ref 30.0–36.0)
MCV: 83.4 fL (ref 80.0–100.0)
NRBC: 0 % (ref 0.0–0.2)
PLATELETS: 276 10*3/uL (ref 150–400)
RBC: 4.77 MIL/uL (ref 4.22–5.81)
RDW: 12.7 % (ref 11.5–15.5)
WBC: 13.6 10*3/uL — AB (ref 4.0–10.5)

## 2018-03-11 LAB — LIPASE, BLOOD: Lipase: 30 U/L (ref 11–51)

## 2018-03-11 MED ORDER — HYDROCODONE-ACETAMINOPHEN 5-325 MG PO TABS
1.0000 | ORAL_TABLET | ORAL | Status: DC | PRN
  Administered 2018-03-13 (×2): 2 via ORAL
  Filled 2018-03-11 (×2): qty 2

## 2018-03-11 MED ORDER — MORPHINE SULFATE (PF) 4 MG/ML IV SOLN
4.0000 mg | Freq: Once | INTRAVENOUS | Status: AC
  Administered 2018-03-11: 4 mg via INTRAVENOUS
  Filled 2018-03-11: qty 1

## 2018-03-11 MED ORDER — ENOXAPARIN SODIUM 40 MG/0.4ML ~~LOC~~ SOLN
40.0000 mg | SUBCUTANEOUS | Status: DC
  Administered 2018-03-11 – 2018-03-12 (×2): 40 mg via SUBCUTANEOUS
  Filled 2018-03-11 (×2): qty 0.4

## 2018-03-11 MED ORDER — SODIUM CHLORIDE 0.9 % IV SOLN
INTRAVENOUS | Status: DC | PRN
  Administered 2018-03-11 – 2018-03-12 (×2): 250 mL via INTRAVENOUS

## 2018-03-11 MED ORDER — ONDANSETRON 4 MG PO TBDP: 4.0000 mg | ORAL_TABLET | Freq: Four times a day (QID) | ORAL | Status: DC | PRN

## 2018-03-11 MED ORDER — SODIUM CHLORIDE 0.9% FLUSH: 3.0000 mL | Freq: Once | INTRAVENOUS | Status: DC

## 2018-03-11 MED ORDER — GLIMEPIRIDE 2 MG PO TABS: 2.0000 mg | ORAL_TABLET | Freq: Every day | ORAL | Status: DC

## 2018-03-11 MED ORDER — DOCUSATE SODIUM 100 MG PO CAPS: 100.0000 mg | ORAL_CAPSULE | Freq: Two times a day (BID) | ORAL | Status: DC | PRN

## 2018-03-11 MED ORDER — GLIMEPIRIDE 4 MG PO TABS
4.0000 mg | ORAL_TABLET | Freq: Every day | ORAL | Status: DC
  Administered 2018-03-12 – 2018-03-13 (×2): 4 mg via ORAL
  Filled 2018-03-11 (×2): qty 1

## 2018-03-11 MED ORDER — TRIAMTERENE-HCTZ 37.5-25 MG PO CAPS
1.0000 | ORAL_CAPSULE | Freq: Every day | ORAL | Status: DC
  Administered 2018-03-12: 1 via ORAL
  Filled 2018-03-11 (×4): qty 1

## 2018-03-11 MED ORDER — ONDANSETRON HCL 4 MG/2ML IJ SOLN: 4.0000 mg | Freq: Four times a day (QID) | INTRAMUSCULAR | Status: DC | PRN

## 2018-03-11 MED ORDER — HYDRALAZINE HCL 10 MG PO TABS: 10.0000 mg | ORAL_TABLET | Freq: Two times a day (BID) | ORAL | Status: DC

## 2018-03-11 MED ORDER — TRAMADOL HCL 50 MG PO TABS: 50.0000 mg | ORAL_TABLET | Freq: Four times a day (QID) | ORAL | Status: DC | PRN

## 2018-03-11 MED ORDER — PIPERACILLIN-TAZOBACTAM 3.375 G IVPB 30 MIN
3.3750 g | Freq: Once | INTRAVENOUS | Status: AC
  Administered 2018-03-11: 3.375 g via INTRAVENOUS
  Filled 2018-03-11: qty 50

## 2018-03-11 MED ORDER — LACTATED RINGERS IV SOLN
INTRAVENOUS | Status: DC
  Administered 2018-03-11 – 2018-03-13 (×5): via INTRAVENOUS

## 2018-03-11 MED ORDER — MORPHINE SULFATE (PF) 2 MG/ML IV SOLN
2.0000 mg | INTRAVENOUS | Status: DC | PRN
  Administered 2018-03-11: 2 mg via INTRAVENOUS
  Filled 2018-03-11: qty 1

## 2018-03-11 MED ORDER — PIPERACILLIN-TAZOBACTAM 3.375 G IVPB
3.3750 g | Freq: Three times a day (TID) | INTRAVENOUS | Status: DC
  Administered 2018-03-11 – 2018-03-13 (×6): 3.375 g via INTRAVENOUS
  Filled 2018-03-11 (×6): qty 50

## 2018-03-11 MED ORDER — HYDRALAZINE HCL 50 MG PO TABS
50.0000 mg | ORAL_TABLET | Freq: Two times a day (BID) | ORAL | Status: DC
  Administered 2018-03-11 – 2018-03-13 (×4): 50 mg via ORAL
  Filled 2018-03-11 (×4): qty 1

## 2018-03-11 MED ORDER — COLCHICINE 0.6 MG PO TABS: 0.6000 mg | ORAL_TABLET | Freq: Every day | ORAL | Status: DC | PRN

## 2018-03-11 MED ORDER — AMLODIPINE BESYLATE 10 MG PO TABS
10.0000 mg | ORAL_TABLET | Freq: Every day | ORAL | Status: DC
  Administered 2018-03-12 – 2018-03-13 (×2): 10 mg via ORAL
  Filled 2018-03-11 (×2): qty 1

## 2018-03-11 MED ORDER — IOPAMIDOL (ISOVUE-300) INJECTION 61%
100.0000 mL | Freq: Once | INTRAVENOUS | Status: AC | PRN
  Administered 2018-03-11: 100 mL via INTRAVENOUS

## 2018-03-11 NOTE — Consult Note (Signed)
Pharmacy Antibiotic Note  Steven Meyers is a 53 y.o. male admitted on 03/11/2018 with diverticulitis.  Pharmacy has been consulted for Zosyn dosing. The plan at this time by surgery is medical management.  Plan: Zosyn 3.375g IV q8h (4 hour infusion).  Height: 6\' 2"  (188 cm) Weight: 215 lb (97.5 kg) IBW/kg (Calculated) : 82.2  Temp (24hrs), Avg:97.6 F (36.4 C), Min:97.6 F (36.4 C), Max:97.6 F (36.4 C)  Recent Labs  Lab 03/11/18 0650  WBC 13.6*  CREATININE 0.79    Estimated Creatinine Clearance: 128.4 mL/min (by C-G formula based on SCr of 0.79 mg/dL).    Antimicrobials this admission: Zosyn 2/4 >>   Microbiology results: 2/4 UCx: pending   Thank you for allowing pharmacy to be a part of this patient's care.  Dallie Piles, PharmD 03/11/2018 11:23 AM

## 2018-03-11 NOTE — ED Notes (Signed)
ED TO INPATIENT HANDOFF REPORT  ED Nurse Name and Phone #: [phone]  Name/Age/Gender Steven Meyers 53 y.o. male Room/Bed: ED16A/ED16A  Code Status   Code Status: Full Code  Home/SNF/Other Home A&O x 4 Is this baseline  yes  Triage Complete: Triage complete  Chief Complaint Abdominal pain  Triage Note Pt arrived to the ED for complaints of abdominal pain for the last 2 days. Pt reports ocasional vomiting and constant feeling to have a BM and a few episodes of diarrhea. Pt is AOx4 in no apparent distress.    Allergies Allergies  Allergen Reactions  . Ace Inhibitors Anaphylaxis and Swelling    Level of Care/Admitting Diagnosis ED Disposition    ED Disposition Condition [facility] Area: [place] [[id number]]  Level of Care: Med-Surg [16]  Diagnosis: Diverticulitis large intestine [518841]  Admitting Physician: Benjamine Sprague [[id number]]  Attending Physician: Benjamine Sprague [[id number]]  Estimated length of stay: 3 - 4 days  Certification:: I certify this patient will need inpatient services for at least 2 midnights  PT Class (Do Not Modify): Inpatient [101]  PT Acc Code (Do Not Modify): Private [1]       Medical/Surgery History Past Medical History:  Diagnosis Date  . Diabetes mellitus without complication ([place])   . Gout   . Hypertension    History reviewed. No pertinent surgical history.   IV Location/Drains/Wounds Patient Lines/Drains/Airways Status   Active Line/Drains/Airways    Name:   Placement date:   Placement time:   Site:   Days:   Peripheral IV 03/11/18 Left;Posterior Forearm   03/11/18    0738    Forearm   less than 1          Intake/Output Last 24 hours  Intake/Output Summary (Last 24 hours) at 03/11/2018 1618 Last data filed at 03/11/2018 6010 Gross per 24 hour  Intake 45.2 ml  Output -  Net 45.2 ml    Labs/Imaging Results for orders placed or performed during the hospital encounter of 03/11/18  (from the past 48 hour(s))  Lipase, blood     Status: None   Collection Time: 03/11/18  6:50 AM  Result Value Ref Range   Lipase 30 11 - 51 U/L    Comment: Performed at [facility], [address] [place], [place] [address]  Comprehensive metabolic panel     Status: Abnormal   Collection Time: 03/11/18  6:50 AM  Result Value Ref Range   Sodium 137 135 - 145 mmol/L   Potassium 3.4 (L) 3.5 - 5.1 mmol/L   Chloride 103 98 - 111 mmol/L   CO2 26 22 - 32 mmol/L   Glucose, Bld 187 (H) 70 - 99 mg/dL   BUN 12 6 - 20 mg/dL   Creatinine, Ser 0.79 0.61 - 1.24 mg/dL   Calcium 9.1 8.9 - 10.3 mg/dL   Total Protein 7.9 6.5 - 8.1 g/dL   Albumin 3.8 3.5 - 5.0 g/dL   AST 18 15 - 41 U/L   ALT 19 0 - 44 U/L   Alkaline Phosphatase 60 38 - 126 U/L   Total Bilirubin 0.9 0.3 - 1.2 mg/dL   GFR calc non Af Amer >60 >60 mL/min   GFR calc Af Amer >60 >60 mL/min   Anion gap 8 5 - 15    Comment: Performed at [facility], [address], [place], [place] [address]  CBC     Status: Abnormal   Collection Time:  03/11/18  6:50 AM  Result Value Ref Range   WBC 13.6 (H) 4.0 - 10.5 K/uL   RBC 4.77 4.22 - 5.81 MIL/uL   Hemoglobin 13.7 13.0 - 17.0 g/dL   HCT 39.8 39.0 - 52.0 %   MCV 83.4 80.0 - 100.0 fL   MCH 28.7 26.0 - 34.0 pg   MCHC 34.4 30.0 - 36.0 g/dL   RDW 12.7 11.5 - 15.5 %   Platelets 276 150 - 400 K/uL   nRBC 0.0 0.0 - 0.2 %    Comment: Performed at [facility], [address] [place], [place] [address]  Urinalysis, Complete w Microscopic     Status: Abnormal   Collection Time: 03/11/18  6:50 AM  Result Value Ref Range   Color, Urine YELLOW (A) YELLOW   APPearance CLEAR (A) CLEAR   Specific Gravity, Urine 1.012 1.005 - 1.030   pH 5.0 5.0 - 8.0   Glucose, UA >=500 (A) NEGATIVE mg/dL   Hgb urine dipstick NEGATIVE NEGATIVE   Bilirubin Urine NEGATIVE NEGATIVE   Ketones, ur NEGATIVE NEGATIVE mg/dL   Protein, ur 30 (A) NEGATIVE mg/dL   Nitrite NEGATIVE NEGATIVE    Leukocytes, UA NEGATIVE NEGATIVE   WBC, UA 0-5 0 - 5 WBC/hpf   Bacteria, UA NONE SEEN NONE SEEN   Squamous Epithelial / LPF NONE SEEN 0 - 5   Mucus PRESENT     Comment: Performed at [facility], [address] [place], [place] [address]   Ct Abdomen Pelvis W Contrast  Result Date: 03/11/2018 CLINICAL DATA:  Abdominal pain with occasional vomiting EXAM: CT ABDOMEN AND PELVIS WITH CONTRAST TECHNIQUE: Multidetector CT imaging of the abdomen and pelvis was performed using the standard protocol following bolus administration of intravenous contrast. CONTRAST:  173mL ISOVUE-300 IOPAMIDOL (ISOVUE-300) INJECTION 61% COMPARISON:  None. FINDINGS: Lower chest: Lung bases are clear. Hepatobiliary: No focal liver lesions are appreciable. Gallbladder wall is not appreciably thickened. There is no biliary duct dilatation. Pancreas: No pancreatic mass or inflammatory focus. Spleen: No splenic lesions are evident. Adrenals/Urinary Tract: Adrenals bilaterally appear unremarkable. Kidneys bilaterally show no evident mass or hydronephrosis on either side. There is a 2 mm calculus in the lower pole of the left kidney. There is no appreciable ureteral calculus on either side. Urinary bladder is midline with wall thickness slightly increased. Stomach/Bowel: There are multiple sigmoid diverticula. There is wall thickening in the mid sigmoid colon with moderate mesenteric stranding and mild fluid consistent with diverticulitis involving the mid sigmoid colon. There is no appreciable abscess in this area. There are a few tiny foci of extraluminal air consistent with microperforation in this area of diverticulitis. There is no free air elsewhere. No portal venous air. No evident bowel obstruction. No other areas suggesting diverticulitis. Vascular/Lymphatic: There is no abdominal aortic aneurysm. Aorta is somewhat tortuous. No focal vascular lesions are evident. There is no adenopathy in the abdomen or pelvis.  Reproductive: There are multiple prostatic calculi. Prostate and seminal vesicles are normal in size and contour. No evident pelvic mass. Other: Appendix appears normal. No frank abscess or ascites is evident in the abdomen or pelvis. Musculoskeletal: No blastic or lytic bone lesions. There is degenerative change in the lumbar spine. There is no intramuscular or abdominal wall lesion. IMPRESSION: 1. Midportion sigmoid diverticulitis. There is wall thickening with irregular diverticula as well as mesenteric stranding and mild fluid. Evidence of microperforation in this area noted. No appreciable abscess. 2. No bowel obstruction. No abscess elsewhere in  the abdomen or pelvis. Appendix appears normal. 3. Mild urinary bladder wall thickening may indicate cystitis or may be secondary to nearby diverticulitis. 4. Nonobstructing 2 mm calculus lower pole left kidney. Multiple prostatic calculi noted. Critical Value/emergent results were called by telephone at the time of interpretation on 03/11/2018 at 8:23 am to Dr. Lenise Arena , who verbally acknowledged these results. Electronically Signed   By: Lowella Grip III M.D.   On: 03/11/2018 08:23    Pending Labs Unresulted Labs (From admission, onward)    Start     Ordered   03/12/18 1696  Basic metabolic panel  Daily,   STAT     03/11/18 1141   03/12/18 0500  Magnesium  Daily,   STAT     03/11/18 1141   03/12/18 0500  Phosphorus  Daily,   STAT     03/11/18 1141   03/12/18 0500  CBC  Daily,   STAT     03/11/18 1141   03/11/18 1142  HIV antibody (Routine Testing)  Once,   STAT     03/11/18 1141          Vitals/Pain Today's Vitals   03/11/18 1130 03/11/18 1534 03/11/18 1535 03/11/18 1600  BP: 121/87 128/84  124/80  Pulse: 94 98  98  Resp: 20     Temp:      TempSrc:      SpO2: 96% 96%  94%  Weight:      Height:      PainSc:   8      Isolation Precautions No active isolations  Medications Medications  triamterene-hydrochlorothiazide  (DYAZIDE) 37.5-25 MG per capsule 1 capsule (0 capsules Oral Hold 03/11/18 1209)  colchicine tablet 0.6 mg (has no administration in time range)  amLODipine (NORVASC) tablet 10 mg (0 mg Oral Hold 03/11/18 1210)  traMADol (ULTRAM) tablet 50 mg (has no administration in time range)  HYDROcodone-acetaminophen (NORCO/VICODIN) 5-325 MG per tablet 1-2 tablet (has no administration in time range)  morphine 2 MG/ML injection 2 mg (2 mg Intravenous Given 03/11/18 1537)  docusate sodium (COLACE) capsule 100 mg (has no administration in time range)  ondansetron (ZOFRAN-ODT) disintegrating tablet 4 mg (has no administration in time range)    Or  ondansetron (ZOFRAN) injection 4 mg (has no administration in time range)  enoxaparin (LOVENOX) injection 40 mg (40 mg Subcutaneous Given 03/11/18 1204)  lactated ringers infusion ( Intravenous New Bag/Given 03/11/18 1208)  piperacillin-tazobactam (ZOSYN) IVPB 3.375 g (has no administration in time range)  hydrALAZINE (APRESOLINE) tablet 50 mg (has no administration in time range)  glimepiride (AMARYL) tablet 4 mg (has no administration in time range)  iopamidol (ISOVUE-300) 61 % injection 100 mL (100 mLs Intravenous Contrast Given 03/11/18 0757)  morphine 4 MG/ML injection 4 mg (4 mg Intravenous Given 03/11/18 0820)  piperacillin-tazobactam (ZOSYN) IVPB 3.375 g ( Intravenous Stopped 03/11/18 0904)    Mobility  Low fall risk

## 2018-03-11 NOTE — H&P (Signed)
Subjective:   CC: diverticulitis  HPI:  Steven Meyers is a 53 y.o. male who was consulted by willams for issue above.  Symptoms were first noted 3 days ago. Pain is sharp, confined to the LLQ, without radiation.  Associated with nothing specific, exacerbated by nothing specific.     Past Medical History:  has a past medical history of Diabetes mellitus without complication ([place]), Gout, and Hypertension.  Past Surgical History: no surgery in the past  Family History: reviewed and not relevant to cc  Social History:  reports that he has never smoked. He has never used smokeless tobacco. He reports that he does not drink alcohol or use drugs.  Current Medications: glypyride, colchicine prn, HTN meds  Allergies:  Allergies as of 03/11/2018 - Review Complete 03/11/2018  Allergen Reaction Noted  . Ace inhibitors Anaphylaxis and Swelling 07/07/2015    ROS:  General: Denies weight loss, weight gain, fatigue, fevers, chills, and night sweats. Eyes: Denies blurry vision, double vision, eye pain, itchy eyes, and tearing. Ears: Denies hearing loss, earache, and ringing in ears. Nose: Denies sinus pain, congestion, infections, runny nose, and nosebleeds. Mouth/throat: Denies hoarseness, sore throat, bleeding gums, and difficulty swallowing. Heart: Denies chest pain, palpitations, racing heart, irregular heartbeat, leg pain or swelling, and decreased activity tolerance. Respiratory: Denies breathing difficulty, shortness of breath, wheezing, cough, and sputum. GI: Denies change in appetite, heartburn, nausea, vomiting, constipation, diarrhea, and blood in stool. GU: Denies difficulty urinating, pain with urinating, urgency, frequency, blood in urine. Musculoskeletal: Denies joint stiffness, pain, swelling, muscle weakness. Skin: Denies rash, itching, mass, tumors, sores, and boils Neurologic: Denies headache, fainting, dizziness, seizures, numbness, and tingling. Psychiatric: Denies  depression, anxiety, difficulty sleeping, and memory loss. Endocrine: Denies heat or cold intolerance, and increased thirst or urination. Blood/lymph: Denies easy bruising, easy bruising, and swollen glands   Objective:     BP 130/90   Pulse 100   Temp 97.6 F (36.4 C) (Oral)   Resp (!) 21   Ht 6\' 2"  (1.88 m)   Wt 97.5 kg   SpO2 96%   BMI 27.60 kg/m   Constitutional :  alert, cooperative, appears stated age and no distress  Lymphatics/Throat:  no asymmetry, masses, or scars  Respiratory:  clear to auscultation bilaterally  Cardiovascular:  regular rate and rhythm  Gastrointestinal: soft, no guarding, focal tenderness in the LLQ.   Musculoskeletal: Steady gait and movement  Skin: Cool and moist   Psychiatric: Normal affect, non-agitated, not confused       LABS:  CMP Latest Ref Rng & Units 03/11/2018 07/07/2015 07/10/2011  Glucose 70 - 99 mg/dL 187(H) 147(H) 211(H)  BUN 6 - 20 mg/dL 12 14 12   Creatinine 0.61 - 1.24 mg/dL 0.79 0.85 0.88  Sodium 135 - 145 mmol/L 137 137 138  Potassium 3.5 - 5.1 mmol/L 3.4(L) 3.6 3.8  Chloride 98 - 111 mmol/L 103 101 99  CO2 22 - 32 mmol/L 26 25 26   Calcium 8.9 - 10.3 mg/dL 9.1 9.2 9.3  Total Protein 6.5 - 8.1 g/dL 7.9 - 8.3(H)  Total Bilirubin 0.3 - 1.2 mg/dL 0.9 - 0.3  Alkaline Phos 38 - 126 U/L 60 - 82  AST 15 - 41 U/L 18 - 28  ALT 0 - 44 U/L 19 - 28   CBC Latest Ref Rng & Units 03/11/2018 07/07/2015 07/10/2011  WBC 4.0 - 10.5 K/uL 13.6(H) 6.8 5.9  Hemoglobin 13.0 - 17.0 g/dL 13.7 12.8(L) 13.2  Hematocrit 39.0 - 52.0 %  39.8 36.5(L) 38.7(L)  Platelets 150 - 400 K/uL 276 272 255    RADS: CLINICAL DATA:  Abdominal pain with occasional vomiting  EXAM: CT ABDOMEN AND PELVIS WITH CONTRAST  TECHNIQUE: Multidetector CT imaging of the abdomen and pelvis was performed using the standard protocol following bolus administration of intravenous contrast.  CONTRAST:  141mL ISOVUE-300 IOPAMIDOL (ISOVUE-300) INJECTION 61%  COMPARISON:   None.  FINDINGS: Lower chest: Lung bases are clear.  Hepatobiliary: No focal liver lesions are appreciable. Gallbladder wall is not appreciably thickened. There is no biliary duct dilatation.  Pancreas: No pancreatic mass or inflammatory focus.  Spleen: No splenic lesions are evident.  Adrenals/Urinary Tract: Adrenals bilaterally appear unremarkable. Kidneys bilaterally show no evident mass or hydronephrosis on either side. There is a 2 mm calculus in the lower pole of the left kidney. There is no appreciable ureteral calculus on either side. Urinary bladder is midline with wall thickness slightly increased.  Stomach/Bowel: There are multiple sigmoid diverticula. There is wall thickening in the mid sigmoid colon with moderate mesenteric stranding and mild fluid consistent with diverticulitis involving the mid sigmoid colon. There is no appreciable abscess in this area. There are a few tiny foci of extraluminal air consistent with microperforation in this area of diverticulitis. There is no free air elsewhere. No portal venous air. No evident bowel obstruction. No other areas suggesting diverticulitis.  Vascular/Lymphatic: There is no abdominal aortic aneurysm. Aorta is somewhat tortuous. No focal vascular lesions are evident. There is no adenopathy in the abdomen or pelvis.  Reproductive: There are multiple prostatic calculi. Prostate and seminal vesicles are normal in size and contour. No evident pelvic mass.  Other: Appendix appears normal. No frank abscess or ascites is evident in the abdomen or pelvis.  Musculoskeletal: No blastic or lytic bone lesions. There is degenerative change in the lumbar spine. There is no intramuscular or abdominal wall lesion.  IMPRESSION: 1. Midportion sigmoid diverticulitis. There is wall thickening with irregular diverticula as well as mesenteric stranding and mild fluid. Evidence of microperforation in this area noted.  No appreciable abscess.  2. No bowel obstruction. No abscess elsewhere in the abdomen or pelvis. Appendix appears normal.  3. Mild urinary bladder wall thickening may indicate cystitis or may be secondary to nearby diverticulitis.  4. Nonobstructing 2 mm calculus lower pole left kidney. Multiple prostatic calculi noted.  Critical Value/emergent results were called by telephone at the time of interpretation on 03/11/2018 at 8:23 am to Dr. Lenise Arena , who verbally acknowledged these results.   Electronically Signed   By: Lowella Grip III M.D.   On: 03/11/2018 08:23 Assessment:   Sigmoid diverticulitis  Plan:   Mild, small microperf, no abscess formation, no acute abdomen.  Will admit for IV abx, ice chips, pain control, and continue to monitor.  Discussed with patient how he will eventually need colonoscopy, and that this disease may become a chronic issue that may need surgery in the future

## 2018-03-11 NOTE — ED Triage Notes (Signed)
Pt arrived to the ED for complaints of abdominal pain for the last 2 days. Pt reports ocasional vomiting and constant feeling to have a BM and a few episodes of diarrhea. Pt is AOx4 in no apparent distress.

## 2018-03-11 NOTE — ED Notes (Signed)
MD approved ice chips for pt at this time

## 2018-03-11 NOTE — ED Notes (Addendum)
Attempted to call report. Unable to complete report at this time

## 2018-03-11 NOTE — ED Provider Notes (Signed)
[facility] Emergency Department Provider Note       Time seen: ----------------------------------------- 7:06 AM on 03/11/2018 -----------------------------------------   I have reviewed the triage vital signs and the nursing notes.  HISTORY   Chief Complaint Abdominal Pain   HPI Steven Meyers is a 53 y.o. male with a history of diabetes, gout, hypertension who presents to the ED for abdominal pain for the past 2 days.  Does not report any vomiting but does have a constant feeling like he is going to have a bowel movement with a few episodes of diarrhea. Pain is in the LLQ.   Past Medical History:  Diagnosis Date  . Diabetes mellitus without complication ([place])   . Gout   . Hypertension     There are no active problems to display for this patient.   History reviewed. No pertinent surgical history.  Allergies Ace inhibitors  Social History Social History   Tobacco Use  . Smoking status: Never Smoker  . Smokeless tobacco: Never Used  Substance Use Topics  . Alcohol use: No  . Drug use: No   Review of Systems Constitutional: Negative for fever. Cardiovascular: Negative for chest pain. Respiratory: Negative for shortness of breath. Gastrointestinal: Positive for abdominal pain, diarrhea Genitourinary: Negative for dysuria. Musculoskeletal: Negative for back pain. Skin: Negative for rash. Neurological: Negative for headaches, focal weakness or numbness.  All systems negative/normal/unremarkable except as stated in the HPI  ____________________________________________   PHYSICAL EXAM:  VITAL SIGNS: ED Triage Vitals  Enc Vitals Group     BP 03/11/18 0648 130/86     Pulse Rate 03/11/18 0648 (!) 104     Resp 03/11/18 0648 17     Temp 03/11/18 0648 97.6 F (36.4 C)     Temp Source 03/11/18 0648 Oral     SpO2 03/11/18 0648 97 %     Weight 03/11/18 0649 215 lb (97.5 kg)     Height 03/11/18 0649 6\' 2"  (1.88 m)     Head  Circumference --      Peak Flow --      Pain Score 03/11/18 0655 8     Pain Loc --      Pain Edu? --      Excl. in [place]? --    Constitutional: Alert and oriented. Well appearing and in no distress. Eyes: Conjunctivae are normal. Normal extraocular movements. ENT      Head: Normocephalic and atraumatic.      Nose: No congestion/rhinnorhea.      Mouth/Throat: Mucous membranes are moist.      Neck: No stridor. Cardiovascular: Normal rate, regular rhythm. No murmurs, rubs, or gallops. Respiratory: Normal respiratory effort without tachypnea nor retractions. Breath sounds are clear and equal bilaterally. No wheezes/rales/rhonchi. Gastrointestinal: Positive for left lower quadrant pain Musculoskeletal: Nontender with normal range of motion in extremities. No lower extremity tenderness nor edema. Neurologic:  Normal speech and language. No gross focal neurologic deficits are appreciated.  Skin:  Skin is warm, dry and intact. No rash noted. Psychiatric: Mood and affect are normal. Speech and behavior are normal.  ____________________________________________  ED COURSE:  As part of my medical decision making, I reviewed the following data within the [place] History obtained from family if available, nursing notes, old chart and ekg, as well as notes from prior ED visits. Patient presented for abdominal pain, we will assess with labs and imaging as indicated at this time.   Procedures ____________________________________________   LABS (pertinent  positives/negatives)  Labs Reviewed  COMPREHENSIVE METABOLIC PANEL - Abnormal; Notable for the following components:      Result Value   Potassium 3.4 (*)    Glucose, Bld 187 (*)    All other components within normal limits  CBC - Abnormal; Notable for the following components:   WBC 13.6 (*)    All other components within normal limits  URINALYSIS, COMPLETE (UACMP) WITH MICROSCOPIC - Abnormal; Notable for the following  components:   Color, Urine YELLOW (*)    APPearance CLEAR (*)    Glucose, UA >=500 (*)    Protein, ur 30 (*)    All other components within normal limits  LIPASE, BLOOD    RADIOLOGY Images were viewed by me  CT the abdomen pelvis with contrast IMPRESSION: 1. Midportion sigmoid diverticulitis. There is wall thickening with irregular diverticula as well as mesenteric stranding and mild fluid. Evidence of microperforation in this area noted. No appreciable abscess.  2. No bowel obstruction. No abscess elsewhere in the abdomen or pelvis. Appendix appears normal.  3. Mild urinary bladder wall thickening may indicate cystitis or may be secondary to nearby diverticulitis.  4. Nonobstructing 2 mm calculus lower pole left kidney. Multiple prostatic calculi noted.  Critical Value/emergent results were called by telephone at the time of interpretation on 03/11/2018 at 8:23 am to Dr. Lenise Arena , who verbally acknowledged these results. ____________________________________________   DIFFERENTIAL DIAGNOSIS   Diverticulitis, constipation, renal colic, flank pain  FINAL ASSESSMENT AND PLAN  Diverticulitis   Plan: The patient had presented for left lower quadrant pain. Patient's labs did indicate leukocytosis. Patient's imaging did reveal diverticulitis with microperforation.  I will order IV Zosyn and discussed with general surgery for possible admission for observation and continued antibiotics    Laurence Aly, MD    Note: This note was generated in part or whole with voice recognition software. Voice recognition is usually quite accurate but there are transcription errors that can and very often do occur. I apologize for any typographical errors that were not detected and corrected.     Earleen Newport, MD 03/11/18 [phone]

## 2018-03-12 LAB — BASIC METABOLIC PANEL
Anion gap: 9 (ref 5–15)
BUN: 10 mg/dL (ref 6–20)
CO2: 26 mmol/L (ref 22–32)
Calcium: 8.8 mg/dL — ABNORMAL LOW (ref 8.9–10.3)
Chloride: 104 mmol/L (ref 98–111)
Creatinine, Ser: 1.05 mg/dL (ref 0.61–1.24)
Glucose, Bld: 181 mg/dL — ABNORMAL HIGH (ref 70–99)
Potassium: 3.2 mmol/L — ABNORMAL LOW (ref 3.5–5.1)
Sodium: 139 mmol/L (ref 135–145)

## 2018-03-12 LAB — CBC
HCT: 37.9 % — ABNORMAL LOW (ref 39.0–52.0)
Hemoglobin: 12.6 g/dL — ABNORMAL LOW (ref 13.0–17.0)
MCH: 28.5 pg (ref 26.0–34.0)
MCHC: 33.2 g/dL (ref 30.0–36.0)
MCV: 85.7 fL (ref 80.0–100.0)
Platelets: 242 10*3/uL (ref 150–400)
RBC: 4.42 MIL/uL (ref 4.22–5.81)
RDW: 13.1 % (ref 11.5–15.5)
WBC: 11.1 10*3/uL — ABNORMAL HIGH (ref 4.0–10.5)
nRBC: 0 % (ref 0.0–0.2)

## 2018-03-12 LAB — GLUCOSE, CAPILLARY
GLUCOSE-CAPILLARY: 122 mg/dL — AB (ref 70–99)
Glucose-Capillary: 181 mg/dL — ABNORMAL HIGH (ref 70–99)
Glucose-Capillary: 118 mg/dL — ABNORMAL HIGH (ref 70–99)
Glucose-Capillary: 106 mg/dL — ABNORMAL HIGH (ref 70–99)

## 2018-03-12 LAB — HIV ANTIBODY (ROUTINE TESTING W REFLEX): HIV SCREEN 4TH GENERATION: NONREACTIVE

## 2018-03-12 LAB — MAGNESIUM: Magnesium: 2.1 mg/dL (ref 1.7–2.4)

## 2018-03-12 LAB — PHOSPHORUS: Phosphorus: 3.1 mg/dL (ref 2.5–4.6)

## 2018-03-12 MED ORDER — INSULIN ASPART 100 UNIT/ML ~~LOC~~ SOLN
0.0000 [IU] | Freq: Three times a day (TID) | SUBCUTANEOUS | Status: DC
  Administered 2018-03-12: 3 [IU] via SUBCUTANEOUS
  Administered 2018-03-12: 1 [IU] via SUBCUTANEOUS
  Administered 2018-03-13: 3 [IU] via SUBCUTANEOUS
  Filled 2018-03-12 (×3): qty 1

## 2018-03-12 MED ORDER — POTASSIUM CHLORIDE CRYS ER 20 MEQ PO TBCR
40.0000 meq | EXTENDED_RELEASE_TABLET | Freq: Once | ORAL | Status: AC
  Administered 2018-03-12: 40 meq via ORAL
  Filled 2018-03-12: qty 2

## 2018-03-12 NOTE — Progress Notes (Signed)
Subjective:  CC:  Steven Meyers is a 53 y.o. male  Hospital stay day 1,   diverticulitis  HPI: No issues overnight.  Pain better.  hungry  ROS:  General: Denies weight loss, weight gain, fatigue, fevers, chills, and night sweats. Heart: Denies chest pain, palpitations, racing heart, irregular heartbeat, leg pain or swelling, and decreased activity tolerance. Respiratory: Denies breathing difficulty, shortness of breath, wheezing, cough, and sputum. GI: Denies change in appetite, heartburn, nausea, vomiting, constipation, diarrhea, and blood in stool. GU: Denies difficulty urinating, pain with urinating, urgency, frequency, blood in urine.   Objective:      Temp:  [98.6 F (37 C)-99.2 F (37.3 C)] 98.6 F (37 C) (02/05 0540) Pulse Rate:  [94-105] 95 (02/05 0540) Resp:  [15-21] 18 (02/05 0540) BP: (108-143)/(80-94) 108/80 (02/05 0540) SpO2:  [93 %-98 %] 96 % (02/05 0540)     Height: 6\' 2"  (188 cm) Weight: 97.5 kg BMI (Calculated): 27.59   Intake/Output this shift:   Intake/Output Summary (Last 24 hours) at 03/12/2018 0745 Last data filed at 03/12/2018 4235 Gross per 24 hour  Intake 1508.16 ml  Output -  Net 1508.16 ml        Constitutional :  alert, cooperative, appears stated age and no distress  Respiratory:  clear to auscultation bilaterally  Cardiovascular:  regular rate and rhythm  Gastrointestinal: soft, no guarding, still tender in LLQ but improved since last exam.   Skin: Cool and moist.   Psychiatric: Normal affect, non-agitated, not confused       LABS:  CMP Latest Ref Rng & Units 03/12/2018 03/11/2018 07/07/2015  Glucose 70 - 99 mg/dL 181(H) 187(H) 147(H)  BUN 6 - 20 mg/dL 10 12 14   Creatinine 0.61 - 1.24 mg/dL 1.05 0.79 0.85  Sodium 135 - 145 mmol/L 139 137 137  Potassium 3.5 - 5.1 mmol/L 3.2(L) 3.4(L) 3.6  Chloride 98 - 111 mmol/L 104 103 101  CO2 22 - 32 mmol/L 26 26 25   Calcium 8.9 - 10.3 mg/dL 8.8(L) 9.1 9.2  Total Protein 6.5 - 8.1 g/dL - 7.9 -   Total Bilirubin 0.3 - 1.2 mg/dL - 0.9 -  Alkaline Phos 38 - 126 U/L - 60 -  AST 15 - 41 U/L - 18 -  ALT 0 - 44 U/L - 19 -   CBC Latest Ref Rng & Units 03/12/2018 03/11/2018 07/07/2015  WBC 4.0 - 10.5 K/uL 11.1(H) 13.6(H) 6.8  Hemoglobin 13.0 - 17.0 g/dL 12.6(L) 13.7 12.8(L)  Hematocrit 39.0 - 52.0 % 37.9(L) 39.8 36.5(L)  Platelets 150 - 400 K/uL 242 276 272    RADS: n/a Assessment:   Sigmoid diverticulitis.  Exam and pain improving.  Start clears. Continue IV abx.  Keep fluids for one more day due to slight increase in Cr.  Continue to monitor

## 2018-03-13 LAB — CBC
HCT: 36.6 % — ABNORMAL LOW (ref 39.0–52.0)
HEMOGLOBIN: 12.3 g/dL — AB (ref 13.0–17.0)
MCH: 28.3 pg (ref 26.0–34.0)
MCHC: 33.6 g/dL (ref 30.0–36.0)
MCV: 84.1 fL (ref 80.0–100.0)
NRBC: 0 % (ref 0.0–0.2)
Platelets: 250 10*3/uL (ref 150–400)
RBC: 4.35 MIL/uL (ref 4.22–5.81)
RDW: 12.7 % (ref 11.5–15.5)
WBC: 10.1 10*3/uL (ref 4.0–10.5)

## 2018-03-13 LAB — BASIC METABOLIC PANEL
Anion gap: 8 (ref 5–15)
BUN: 8 mg/dL (ref 6–20)
CO2: 25 mmol/L (ref 22–32)
Calcium: 9 mg/dL (ref 8.9–10.3)
Chloride: 104 mmol/L (ref 98–111)
Creatinine, Ser: 0.98 mg/dL (ref 0.61–1.24)
GFR calc Af Amer: >60 mL/min (ref >60)
GFR calc non Af Amer: >60 mL/min (ref >60)
Glucose, Bld: 167 mg/dL — ABNORMAL HIGH (ref 70–99)
Potassium: 3.9 mmol/L (ref 3.5–5.1)
Sodium: 137 mmol/L (ref 135–145)

## 2018-03-13 LAB — PHOSPHORUS: Phosphorus: 3.7 mg/dL (ref 2.5–4.6)

## 2018-03-13 LAB — GLUCOSE, CAPILLARY
Glucose-Capillary: 169 mg/dL — ABNORMAL HIGH (ref 70–99)
Glucose-Capillary: 152 mg/dL — ABNORMAL HIGH (ref 70–99)

## 2018-03-13 LAB — MAGNESIUM: Magnesium: 2.1 mg/dL (ref 1.7–2.4)

## 2018-03-13 MED ORDER — IBUPROFEN 800 MG PO TABS: 800.0000 mg | ORAL_TABLET | Freq: Three times a day (TID) | ORAL | 0 refills | Status: AC | PRN

## 2018-03-13 MED ORDER — METRONIDAZOLE 500 MG PO TABS: 500.0000 mg | ORAL_TABLET | Freq: Three times a day (TID) | ORAL | 0 refills | Status: AC

## 2018-03-13 MED ORDER — ACETAMINOPHEN 325 MG PO TABS: 650.0000 mg | ORAL_TABLET | Freq: Three times a day (TID) | ORAL | 0 refills | Status: AC | PRN

## 2018-03-13 MED ORDER — CIPROFLOXACIN HCL 500 MG PO TABS: 500.0000 mg | ORAL_TABLET | Freq: Two times a day (BID) | ORAL | 0 refills | Status: AC

## 2018-03-13 MED ORDER — TRIAMTERENE-HCTZ 37.5-25 MG PO TABS
1.0000 | ORAL_TABLET | Freq: Every day | ORAL | Status: DC
  Filled 2018-03-13: qty 1

## 2018-03-13 MED ORDER — DOCUSATE SODIUM 100 MG PO CAPS: 100.0000 mg | ORAL_CAPSULE | Freq: Two times a day (BID) | ORAL | 0 refills | Status: AC | PRN

## 2018-03-13 NOTE — Discharge Summary (Signed)
Physician Discharge Summary  Patient ID: Steven Meyers MRN: [id number] DOB/AGE: 51-Aug-1967 51 y.o.  Admit date: 03/11/2018 Discharge date: 03/13/2018  Admission Diagnoses: sigmoid diverticulitis  Discharge Diagnoses:  Same as above  Discharged Condition: good  Hospital Course: admitted for above, underwent IV abx, NPO treatment.  Pain and wbc resolved overtime, diet advanced and switched to PO abx and deemed appropriate for d/c  Consults: None  Discharge Exam: Blood pressure 116/80, pulse 74, temperature 98.4 F (36.9 C), temperature source Oral, resp. rate 19, height 6\' 2"  (1.88 m), weight 97.5 kg, SpO2 97 %. General appearance: alert, cooperative and no distress GI: soft, non-tender; bowel sounds normal; no masses,  no organomegaly  Disposition:  Discharge disposition: 01-Home or Self Care       Discharge Instructions    Discharge patient   Complete by:  As directed    Discharge disposition:  01-Home or Self Care   Discharge patient date:  03/13/2018     Allergies as of 03/13/2018      Reactions   Ace Inhibitors Anaphylaxis, Swelling      Medication List    TAKE these medications   acetaminophen 325 MG tablet Commonly known as:  TYLENOL Take 2 tablets (650 mg total) by mouth every 8 (eight) hours as needed for up to 30 days for mild pain.   amLODipine 10 MG tablet Commonly known as:  NORVASC Take 10 mg by mouth daily.   aspirin 81 MG chewable tablet Chew 81 mg by mouth daily.   ciprofloxacin 500 MG tablet Commonly known as:  CIPRO Take 1 tablet (500 mg total) by mouth 2 (two) times daily for 7 days.   colchicine 0.6 MG tablet Take 0.6 mg by mouth daily as needed.   docusate sodium 100 MG capsule Commonly known as:  COLACE Take 1 capsule (100 mg total) by mouth 2 (two) times daily as needed for up to 10 days for mild constipation.   glimepiride 4 MG tablet Commonly known as:  AMARYL Take 4 mg by mouth daily with breakfast.   hydrALAZINE 50 MG  tablet Commonly known as:  APRESOLINE Take 50 mg by mouth 2 (two) times daily.   ibuprofen 800 MG tablet Commonly known as:  ADVIL,MOTRIN Take 1 tablet (800 mg total) by mouth every 8 (eight) hours as needed for mild pain or moderate pain.   metroNIDAZOLE 500 MG tablet Commonly known as:  FLAGYL Take 1 tablet (500 mg total) by mouth 3 (three) times daily for 7 days.   triamterene-hydrochlorothiazide 37.5-25 MG capsule Commonly known as:  DYAZIDE Take 1 capsule by mouth daily.      Follow-up Information    [name], Steven Follansbee, DO Follow up in 2 week(s).   Specialty:  Surgery Why:  for followup and discuss colonoscopy Contact information: [place] [address] [phone]            Total time spent arranging discharge was >59min. Signed: Benjamine Sprague 03/13/2018, 10:07 AM

## 2018-03-13 NOTE — Progress Notes (Signed)
Steven Meyers to be D/C'd  per MD order.  Discussed prescriptions and follow up appointments with the patient. Prescriptions given to patient, medication list explained in detail. Pt verbalized understanding.    Vitals:   03/13/18 0514 03/13/18 1237  BP: 116/80 107/87  Pulse: 74 94  Resp: 19 16  Temp: 98.4 F (36.9 C) 97.6 F (36.4 C)  SpO2: 97% 100%    Skin clean, dry and intact without evidence of skin break down, no evidence of skin tears noted. IV catheter discontinued intact. Site without signs and symptoms of complications. Dressing and pressure applied. Pt denies pain at this time. No complaints noted.  An After Visit Summary was printed and given to the patient. Patient escorted via [place], and D/C home via private auto.  [name] [name]

## 2018-03-13 NOTE — Discharge Instructions (Signed)

## 2018-03-13 NOTE — Care Management Note (Signed)
Case Management Note  Patient Details  Name: Steven Meyers MRN: [id number] Date of Birth: [birth year]   Patient discharged home today.  prescriptions were escribed to [facility].  Patient was provided coupons from [email or web address].  Cipro $7.98, and flagyl $7.95   Subjective/Objective:                    Action/Plan:   Expected Discharge Date:  03/13/18               Expected Discharge Plan:  Home/Self Care  In-House Referral:     Discharge planning Services  CM Consult, Medication Assistance  Post Acute Care Choice:    Choice offered to:     DME Arranged:    DME Agency:     HH Arranged:    HH Agency:     Status of Service:  Completed, signed off  If discussed at [employer] of Stay Meetings, dates discussed:    Additional Comments:  Beverly Sessions, RN 03/13/2018, 1:55 PM

## 2018-06-12 ENCOUNTER — Ambulatory Visit: Admit: 2018-06-12 | Payer: MEDICAID | Admitting: Surgery

## 2018-06-12 SURGERY — COLONOSCOPY WITH PROPOFOL
Anesthesia: General

## 2018-10-06 NOTE — H&P (View-Only) (Signed)
Subjective:   CC: Diverticulitis of large intestine without perforation or abscess without bleeding [K57.32]   HPI:  Steven Meyers is a 53 y.o. male who is here for followup from above.  Finished abx.  Doing well.     Current Medications: has a current medication list which includes the following prescription(s): allopurinol, amlodipine, aspirin, colchicine, glimepiride, hydralazine, meloxicam, and triamterene-hydrochlorothiazide.  Allergies:       Allergies  Allergen Reactions  . Ace Inhibitors Other (See Comments)    Unspecified  . Ace Inhibitors Unknown    ROS: General: Denies weight loss, weight gain, fatigue, fevers, chills, and night sweats. Heart: Denies chest pain, palpitations, racing heart, irregular heartbeat, leg pain or swelling, and decreased activity tolerance. Respiratory: Denies breathing difficulty, shortness of breath, wheezing, cough, and sputum. GI: Denies change in appetite, heartburn, nausea, vomiting, constipation, diarrhea, and blood in stool. GU: Denies difficulty urinating, pain with urinating, urgency, frequency, blood in urine    Objective:     BP 150/89   Pulse 105   Ht 188 cm (6\' 2" )   Wt 100.4 kg (221 lb 5.1 oz)   BMI 28.42 kg/m   Constitutional :  alert, appears stated age, cooperative and no distress  Gastrointestinal: soft, non-tender; bowel sounds normal; no masses,  no organomegaly.    Musculoskeletal: Steady gait and movement  Skin: Cool and moist    Psychiatric: Normal affect, non-agitated, not confused       LABS:  N/A   RADS: N/A  Assessment:      Diverticulitis of large intestine without perforation or abscess without bleeding [K57.32]  Plan:     1. Recovered well.  Due for colonoscopy.  Will arrange.  R/b/a discussed.  Risks include bleeding/perforation.  Benefits include polp removal, prevention of colon CA.  Alternatives include continued observation.  Pt understands and agree with proceeding  with colonoscopy    Electronically signed by Benjamine Sprague, DO on 04/18/2018 9:28 AM

## 2018-10-06 NOTE — H&P (Signed)
Subjective:   CC: Diverticulitis of large intestine without perforation or abscess without bleeding [K57.32]   HPI:  Steven Meyers is a 53 y.o. male who is here for followup from above.  Finished abx.  Doing well.     Current Medications: has a current medication list which includes the following prescription(s): allopurinol, amlodipine, aspirin, colchicine, glimepiride, hydralazine, meloxicam, and triamterene-hydrochlorothiazide.  Allergies:       Allergies  Allergen Reactions  . Ace Inhibitors Other (See Comments)    Unspecified  . Ace Inhibitors Unknown    ROS: General: Denies weight loss, weight gain, fatigue, fevers, chills, and night sweats. Heart: Denies chest pain, palpitations, racing heart, irregular heartbeat, leg pain or swelling, and decreased activity tolerance. Respiratory: Denies breathing difficulty, shortness of breath, wheezing, cough, and sputum. GI: Denies change in appetite, heartburn, nausea, vomiting, constipation, diarrhea, and blood in stool. GU: Denies difficulty urinating, pain with urinating, urgency, frequency, blood in urine    Objective:     BP 150/89   Pulse 105   Ht 188 cm (6\' 2" )   Wt 100.4 kg (221 lb 5.1 oz)   BMI 28.42 kg/m   Constitutional :  alert, appears stated age, cooperative and no distress  Gastrointestinal: soft, non-tender; bowel sounds normal; no masses,  no organomegaly.    Musculoskeletal: Steady gait and movement  Skin: Cool and moist    Psychiatric: Normal affect, non-agitated, not confused       LABS:  N/A   RADS: N/A  Assessment:      Diverticulitis of large intestine without perforation or abscess without bleeding [K57.32]  Plan:     1. Recovered well.  Due for colonoscopy.  Will arrange.  R/b/a discussed.  Risks include bleeding/perforation.  Benefits include polp removal, prevention of colon CA.  Alternatives include continued observation.  Pt understands and agree with proceeding  with colonoscopy    Electronically signed by Benjamine Sprague, DO on 04/18/2018 9:28 AM

## 2018-10-27 ENCOUNTER — Other Ambulatory Visit: Admission: RE | Admit: 2018-10-27 | Payer: Self-pay | Source: Ambulatory Visit

## 2018-10-30 ENCOUNTER — Ambulatory Visit: Payer: Self-pay | Admitting: Certified Registered"

## 2018-10-30 ENCOUNTER — Encounter: Admission: RE | Payer: Self-pay | Source: Home / Self Care

## 2018-10-30 ENCOUNTER — Encounter
Admission: RE | Disposition: A | Discharge status: Home / Self Care | Payer: Self-pay | Source: Home / Self Care | Attending: Surgery

## 2018-10-30 ENCOUNTER — Ambulatory Visit: Admission: RE | Admit: 2018-10-30 | Payer: Self-pay | Source: Home / Self Care | Admitting: Surgery

## 2018-10-30 ENCOUNTER — Ambulatory Visit
Admission: RE | Admit: 2018-10-30 | Discharge: 2018-10-30 | Disposition: A | Discharge status: Home / Self Care | Payer: Self-pay | Attending: Surgery | Admitting: Surgery

## 2018-10-30 ENCOUNTER — Encounter: Payer: Self-pay | Admitting: Surgery

## 2018-10-30 ENCOUNTER — Other Ambulatory Visit: Payer: Self-pay

## 2018-10-30 DIAGNOSIS — D120 Benign neoplasm of cecum: Secondary | ICD-10-CM | POA: Insufficient documentation

## 2018-10-30 DIAGNOSIS — Z79899 Other long term (current) drug therapy: Secondary | ICD-10-CM | POA: Insufficient documentation

## 2018-10-30 DIAGNOSIS — K640 First degree hemorrhoids: Secondary | ICD-10-CM | POA: Insufficient documentation

## 2018-10-30 DIAGNOSIS — Z7982 Long term (current) use of aspirin: Secondary | ICD-10-CM | POA: Insufficient documentation

## 2018-10-30 DIAGNOSIS — E119 Type 2 diabetes mellitus without complications: Secondary | ICD-10-CM | POA: Insufficient documentation

## 2018-10-30 DIAGNOSIS — K5730 Diverticulosis of large intestine without perforation or abscess without bleeding: Secondary | ICD-10-CM | POA: Insufficient documentation

## 2018-10-30 DIAGNOSIS — I10 Essential (primary) hypertension: Secondary | ICD-10-CM | POA: Insufficient documentation

## 2018-10-30 DIAGNOSIS — Z791 Long term (current) use of non-steroidal anti-inflammatories (NSAID): Secondary | ICD-10-CM | POA: Insufficient documentation

## 2018-10-30 HISTORY — PX: COLONOSCOPY WITH PROPOFOL: SHX5780

## 2018-10-30 LAB — GLUCOSE, CAPILLARY: Glucose-Capillary: 100 mg/dL — ABNORMAL HIGH (ref 70–99)

## 2018-10-30 SURGERY — COLONOSCOPY WITH PROPOFOL
Anesthesia: General

## 2018-10-30 MED ORDER — SODIUM CHLORIDE 0.9 % IV SOLN
INTRAVENOUS | Status: DC
  Administered 2018-10-30: 14:00:00 via INTRAVENOUS

## 2018-10-30 MED ORDER — PROPOFOL 500 MG/50ML IV EMUL
INTRAVENOUS | Status: DC | PRN
  Administered 2018-10-30: 120 ug/kg/min via INTRAVENOUS

## 2018-10-30 MED ORDER — LIDOCAINE HCL (CARDIAC) PF 100 MG/5ML IV SOSY
PREFILLED_SYRINGE | INTRAVENOUS | Status: DC | PRN
  Administered 2018-10-30: 60 mg via INTRAVENOUS

## 2018-10-30 MED ORDER — PROPOFOL 10 MG/ML IV BOLUS
INTRAVENOUS | Status: AC
  Filled 2018-10-30: qty 20

## 2018-10-30 MED ORDER — PROPOFOL 10 MG/ML IV BOLUS
INTRAVENOUS | Status: DC | PRN
  Administered 2018-10-30: 90 mg via INTRAVENOUS
  Administered 2018-10-30: 30 mg via INTRAVENOUS
  Administered 2018-10-30: 20 mg via INTRAVENOUS
  Administered 2018-10-30: 30 mg via INTRAVENOUS
  Administered 2018-10-30: 50 mg via INTRAVENOUS
  Administered 2018-10-30: 30 mg via INTRAVENOUS
  Administered 2018-10-30: 50 mg via INTRAVENOUS

## 2018-10-30 MED ORDER — PROPOFOL 500 MG/50ML IV EMUL
INTRAVENOUS | Status: AC
  Filled 2018-10-30: qty 50

## 2018-10-30 MED ORDER — LIDOCAINE HCL (PF) 2 % IJ SOLN
INTRAMUSCULAR | Status: AC
  Filled 2018-10-30: qty 10

## 2018-10-30 NOTE — Anesthesia Preprocedure Evaluation (Signed)
Anesthesia Evaluation  Patient identified by MRN, date of birth, ID band Patient awake    Reviewed: Allergy & Precautions, NPO status , Patient's Chart, lab work & pertinent test results  History of Anesthesia Complications Negative for: history of anesthetic complications  Airway Mallampati: II       Dental   Pulmonary neg sleep apnea, neg COPD, Not current smoker,           Cardiovascular hypertension, Pt. on medications (-) Past MI and (-) CHF (-) dysrhythmias (-) Valvular Problems/Murmurs     Neuro/Psych neg Seizures    GI/Hepatic Neg liver ROS, neg GERD  ,  Endo/Other  diabetes, Type 2, Oral Hypoglycemic Agents  Renal/GU negative Renal ROS     Musculoskeletal   Abdominal   Peds  Hematology   Anesthesia Other Findings   Reproductive/Obstetrics                             Anesthesia Physical Anesthesia Plan  ASA: III  Anesthesia Plan: General   Post-op Pain Management:    Induction: Intravenous  PONV Risk Score and Plan: 2 and Propofol infusion and TIVA  Airway Management Planned: Nasal Cannula  Additional Equipment:   Intra-op Plan:   Post-operative Plan:   Informed Consent: I have reviewed the patients History and Physical, chart, labs and discussed the procedure including the risks, benefits and alternatives for the proposed anesthesia with the patient or authorized representative who has indicated his/her understanding and acceptance.       Plan Discussed with:   Anesthesia Plan Comments:         Anesthesia Quick Evaluation  

## 2018-10-30 NOTE — Transfer of Care (Signed)
Immediate Anesthesia Transfer of Care Note  Patient: Steven Meyers  Procedure(s) Performed: COLONOSCOPY WITH PROPOFOL (N/A )  Patient Location: PACU and Endoscopy Unit  Anesthesia Type:General  Level of Consciousness: awake, alert  and oriented  Airway & Oxygen Therapy: Patient Spontanous Breathing  Post-op Assessment: Report given to RN and Post -op Vital signs reviewed and stable  Post vital signs: Reviewed and stable  Last Vitals:  Vitals Value Taken Time  BP 100/69 10/30/18 1421  Temp 36.7 C 10/30/18 1420  Pulse 87 10/30/18 1423  Resp 19 10/30/18 1423  SpO2 99 % 10/30/18 1423  Vitals shown include unvalidated device data.  Last Pain:  Vitals:   10/30/18 1420  TempSrc: Tympanic         Complications: No apparent anesthesia complications

## 2018-10-30 NOTE — Interval H&P Note (Signed)
History and Physical Interval Note:  10/30/2018 1:31 PM  Steven Meyers  has presented today for surgery, with the diagnosis of DIVERTICULITIS.  The various methods of treatment have been discussed with the patient and family. After consideration of risks, benefits and other options for treatment, the patient has consented to  Procedure(s): COLONOSCOPY WITH PROPOFOL (N/A) as a surgical intervention.  The patient's history has been reviewed, patient examined, no change in status, stable for surgery.  I have reviewed the patient's chart and labs.  Questions were answered to the patient's satisfaction.     Christoffer Currier [name]

## 2018-10-30 NOTE — Op Note (Signed)
[facility] Gastroenterology Patient Name: Steven Meyers Procedure Date: 10/30/2018 1:49 PM MRN: [id number] Account #: [id number] Date of Birth: [birth year] Admit Type: Outpatient Age: 53 Room: [facility] ENDO ROOM 4 Gender: Male Note Status: Finalized Procedure:            Colonoscopy Indications:          Diverticulosis of the colon Providers:            Eliseo Squires MD, MD Referring MD:         Tracie Harrier, MD (Referring MD) Medicines:            Propofol per Anesthesia Complications:        No immediate complications. Procedure:            Pre-Anesthesia Assessment:                       - After reviewing the risks and benefits, the patient                        was deemed in satisfactory condition to undergo the                        procedure in an ambulatory setting.                       After obtaining informed consent, the colonoscope was                        passed under direct vision. Throughout the procedure,                        the patient's blood pressure, pulse, and oxygen                        saturations were monitored continuously. The                        Colonoscope was introduced through the anus and                        advanced to the the cecum, identified by the ileocecal                        valve. The colonoscopy was performed without                        difficulty. The patient tolerated the procedure well.                        The quality of the bowel preparation was adequate. Findings:      The perianal and digital rectal examinations were normal.      Multiple small and large-mouthed diverticula were found in the entire       colon.      A 5 mm polyp was found in the cecum. The polyp was pedunculated. The       polyp was removed with a hot snare. Resection and retrieval were       complete.      Non-bleeding internal hemorrhoids were found during retroflexion. The       hemorrhoids were Grade I (internal  hemorrhoids  that do not prolapse). Impression:           - Diverticulosis in the entire examined colon.                       - One 5 mm polyp in the cecum, removed with a hot                        snare. Resected and retrieved.                       - Non-bleeding internal hemorrhoids. Recommendation:       - Await pathology results.                       - Discharge patient to home.                       - Written discharge instructions were provided to the                        patient. Procedure Code(s):    --- Professional ---                       [id number], Colonoscopy, flexible; with removal of tumor(s),                        polyp(s), or other lesion(s) by snare technique Diagnosis Code(s):    --- Professional ---                       K64.0, First degree hemorrhoids                       K57.30, Diverticulosis of large intestine without                        perforation or abscess without bleeding                       K63.5, Polyp of colon CPT copyright 2019 American Medical Association. All rights reserved. The codes documented in this report are preliminary and upon coder review may  be revised to meet current compliance requirements. Dr. Sheppard Penton, MD Eliseo Squires MD, MD 10/30/2018 2:22:45 PM This report has been signed electronically. Number of Addenda: 0 Note Initiated On: 10/30/2018 1:49 PM Scope Withdrawal Time: 0 hours 17 minutes 3 seconds  Total Procedure Duration: 0 hours 22 minutes 35 seconds  Estimated Blood Loss: Estimated blood loss: none.      [facility]

## 2018-10-30 NOTE — Anesthesia Postprocedure Evaluation (Signed)
Anesthesia Post Note  Patient: Steven Meyers  Procedure(s) Performed: COLONOSCOPY WITH PROPOFOL (N/A )  Patient location during evaluation: Endoscopy Anesthesia Type: General Level of consciousness: awake and alert Pain management: pain level controlled Vital Signs Assessment: post-procedure vital signs reviewed and stable Respiratory status: spontaneous breathing and respiratory function stable Cardiovascular status: stable Anesthetic complications: no     Last Vitals:  Vitals:   10/30/18 1420 10/30/18 1450  BP: 100/69 (!) 129/92  Pulse:    Resp:    Temp: 36.7 C   SpO2:      Last Pain:  Vitals:   10/30/18 1420  TempSrc: Tympanic                 KEPHART,WILLIAM K

## 2018-10-30 NOTE — Anesthesia Post-op Follow-up Note (Signed)
Anesthesia QCDR form completed.        

## 2018-11-03 LAB — SURGICAL PATHOLOGY

## 2020-05-14 ENCOUNTER — Other Ambulatory Visit: Payer: Self-pay

## 2020-05-14 ENCOUNTER — Emergency Department
Admission: EM | Admit: 2020-05-14 | Discharge: 2020-05-14 | Disposition: A | Discharge status: Home / Self Care | Payer: Self-pay | Attending: Emergency Medicine | Admitting: Emergency Medicine

## 2020-05-14 DIAGNOSIS — I10 Essential (primary) hypertension: Secondary | ICD-10-CM | POA: Insufficient documentation

## 2020-05-14 DIAGNOSIS — Z79899 Other long term (current) drug therapy: Secondary | ICD-10-CM | POA: Insufficient documentation

## 2020-05-14 DIAGNOSIS — R1030 Lower abdominal pain, unspecified: Secondary | ICD-10-CM

## 2020-05-14 DIAGNOSIS — Z7982 Long term (current) use of aspirin: Secondary | ICD-10-CM | POA: Insufficient documentation

## 2020-05-14 DIAGNOSIS — Z7984 Long term (current) use of oral hypoglycemic drugs: Secondary | ICD-10-CM | POA: Insufficient documentation

## 2020-05-14 DIAGNOSIS — E119 Type 2 diabetes mellitus without complications: Secondary | ICD-10-CM | POA: Insufficient documentation

## 2020-05-14 LAB — URINALYSIS, COMPLETE (UACMP) WITH MICROSCOPIC
Bacteria, UA: NONE SEEN
Bilirubin Urine: NEGATIVE
Glucose, UA: >=500 mg/dL — AB
Hgb urine dipstick: NEGATIVE
Ketones, ur: NEGATIVE mg/dL
Leukocytes,Ua: NEGATIVE
Nitrite: NEGATIVE
Protein, ur: NEGATIVE mg/dL
Specific Gravity, Urine: 1.009 (ref 1.005–1.030)
pH: 6 (ref 5.0–8.0)

## 2020-05-14 LAB — CBC WITH DIFFERENTIAL/PLATELET
Abs Immature Granulocytes: 0.05 10*3/uL (ref 0.00–0.07)
Basophils Absolute: 0 10*3/uL (ref 0.0–0.1)
Basophils Relative: 0 %
Eosinophils Absolute: 0.1 10*3/uL (ref 0.0–0.5)
Eosinophils Relative: 1 %
HCT: 37.3 % — ABNORMAL LOW (ref 39.0–52.0)
Hemoglobin: 13.2 g/dL (ref 13.0–17.0)
Immature Granulocytes: 1 %
Lymphocytes Relative: 12 %
Lymphs Abs: 1.2 10*3/uL (ref 0.7–4.0)
MCH: 30.4 pg (ref 26.0–34.0)
MCHC: 35.4 g/dL (ref 30.0–36.0)
MCV: 85.9 fL (ref 80.0–100.0)
Monocytes Absolute: 0.9 10*3/uL (ref 0.1–1.0)
Monocytes Relative: 9 %
Neutro Abs: 7.8 10*3/uL — ABNORMAL HIGH (ref 1.7–7.7)
Neutrophils Relative %: 77 %
Platelets: 231 10*3/uL (ref 150–400)
RBC: 4.34 MIL/uL (ref 4.22–5.81)
RDW: 13.4 % (ref 11.5–15.5)
WBC: 10.1 10*3/uL (ref 4.0–10.5)
nRBC: 0 % (ref 0.0–0.2)

## 2020-05-14 LAB — BASIC METABOLIC PANEL
Anion gap: 13 (ref 5–15)
BUN: 11 mg/dL (ref 6–20)
CO2: 25 mmol/L (ref 22–32)
Calcium: 9.3 mg/dL (ref 8.9–10.3)
Chloride: 97 mmol/L — ABNORMAL LOW (ref 98–111)
Creatinine, Ser: 0.94 mg/dL (ref 0.61–1.24)
GFR, Estimated: >60 mL/min (ref >60)
Glucose, Bld: 187 mg/dL — ABNORMAL HIGH (ref 70–99)
Potassium: 3.4 mmol/L — ABNORMAL LOW (ref 3.5–5.1)
Sodium: 135 mmol/L (ref 135–145)

## 2020-05-14 MED ORDER — DICYCLOMINE HCL 10 MG PO CAPS
20.0000 mg | ORAL_CAPSULE | Freq: Once | ORAL | Status: AC
  Administered 2020-05-14: 20 mg via ORAL
  Filled 2020-05-14: qty 2

## 2020-05-14 MED ORDER — DICYCLOMINE HCL 10 MG PO CAPS: 10.0000 mg | ORAL_CAPSULE | Freq: Three times a day (TID) | ORAL | 0 refills | Status: AC | PRN

## 2020-05-14 NOTE — ED Provider Notes (Signed)
[facility] Emergency Department Provider Note   ____________________________________________   I have reviewed the triage vital signs and the nursing notes.   HISTORY  Chief Complaint Abdominal Pain   History limited by: Not Limited   HPI Steven Meyers is a 55 y.o. male who presents to the emergency department today because of concern for abdominal pain. Has been present for the past day and a half. Located in the lower abdomen. It does come and go. He will feel the need to have a bowel movement when the pain comes on. Denies urinary frequency to myself. The patient states that he has not noticed any diarrhea or bloody stool. No change in urination. No nausea or vomiting.   Records reviewed. Per medical record review patient has a history of DM, gout, diverticulitis.   Past Medical History:  Diagnosis Date  . Diabetes mellitus without complication (Steven Meyers)   . Gout   . Hypertension     Patient Active Problem List   Diagnosis Date Noted  . Diverticulitis large intestine 03/11/2018    Past Surgical History:  Procedure Laterality Date  . COLONOSCOPY WITH PROPOFOL N/A 10/30/2018   Procedure: COLONOSCOPY WITH PROPOFOL;  Surgeon: Steven Sprague, DO;  Location: [facility] ENDOSCOPY;  Service: General;  Laterality: N/A;    Prior to Admission medications   Medication Sig Start Date End Date Taking? Authorizing Provider  amLODipine (NORVASC) 10 MG tablet Take 10 mg by mouth daily.    [provider]  aspirin 81 MG chewable tablet Chew 81 mg by mouth daily.    [provider]  colchicine 0.6 MG tablet Take 0.6 mg by mouth daily as needed.    [provider]  glimepiride (AMARYL) 4 MG tablet Take 4 mg by mouth daily with breakfast.     [provider]  hydrALAZINE (APRESOLINE) 50 MG tablet Take 50 mg by mouth 2 (two) times daily.     [provider]  ibuprofen (ADVIL,MOTRIN) 800 MG tablet Take 1 tablet (800 mg total) by  mouth every 8 (eight) hours as needed for mild pain or moderate pain. Patient not taking: Reported on 10/30/2018 03/13/18   Steven Sprague, DO  triamterene-hydrochlorothiazide (DYAZIDE) 37.5-25 MG capsule Take 1 capsule by mouth daily.    [provider]    Allergies Ace inhibitors  No family history on file.  Social History Social History   Tobacco Use  . Smoking status: Never Smoker  . Smokeless tobacco: Never Used  Substance Use Topics  . Alcohol use: No  . Drug use: No    Review of Systems Constitutional: No fever/chills Eyes: No visual changes. ENT: No sore throat. Cardiovascular: Denies chest pain. Respiratory: Denies shortness of breath. Gastrointestinal: Positive for abdominal pain.  No nausea, no vomiting.  No diarrhea.   Genitourinary: Negative for dysuria. Musculoskeletal: Negative for back pain. Skin: Negative for rash. Neurological: Negative for headaches, focal weakness or numbness.  ____________________________________________   PHYSICAL EXAM:  VITAL SIGNS: ED Triage Vitals [05/14/20 1712]  Enc Vitals Group     BP (!) 142/101     Pulse Rate (!) 101     Resp 20     Temp 97.9 F (36.6 C)     Temp src      SpO2 99 %     Weight 215 lb (97.5 kg)     Height 6\' 2"  (1.88 m)     Head Circumference      Peak Flow  Pain Score 3   Constitutional: Alert and oriented.  Eyes: Conjunctivae are normal.  ENT      Head: Normocephalic and atraumatic.      Nose: No congestion/rhinnorhea.      Mouth/Throat: Mucous membranes are moist.      Neck: No stridor. Hematological/Lymphatic/Immunilogical: No cervical lymphadenopathy. Cardiovascular: Normal rate, regular rhythm.  No murmurs, rubs, or gallops.  Respiratory: Normal respiratory effort without tachypnea nor retractions. Breath sounds are clear and equal bilaterally. No wheezes/rales/rhonchi. Gastrointestinal: Soft and minimally tender to palpation in the lower abdomen.  Genitourinary:  Deferred Musculoskeletal: Normal range of motion in all extremities. No lower extremity edema. Neurologic:  Normal speech and language. No gross focal neurologic deficits are appreciated.  Skin:  Skin is warm, dry and intact. No rash noted. Psychiatric: Mood and affect are normal. Speech and behavior are normal. Patient exhibits appropriate insight and judgment.  ____________________________________________    LABS (pertinent positives/negatives)  UA clear, ?500 glucose, 0-5 rbc, bacteria none seen BMP na 135, k 3.4, glu 187, cr 0.94 CBC wbc 10.1, hgb 13.2, plt 231 ____________________________________________   EKG  None  ____________________________________________    RADIOLOGY  None   ____________________________________________   PROCEDURES  Procedures  ____________________________________________   INITIAL IMPRESSION / ASSESSMENT AND PLAN / ED COURSE  Pertinent labs & imaging results that were available during my care of the patient were reviewed by me and considered in my medical decision making (see chart for details).   Patient presented to the emergency department because of concern for abdominal pain. On exam patient with some minimal pain in the lower abdomen. Afebrile. No leukocytosis. The patient did have some improvement after bentyl. Discussed possibility of obtaining CT scan, however at this time patient was comfortable deferring which I think is reasonable. I have low suspicion for significant intraabdominal infection or obstruction. We did discuss importance of close follow up for any worsening or changing symptoms.   ____________________________________________   FINAL CLINICAL IMPRESSION(S) / ED DIAGNOSES  Final diagnoses:  Lower abdominal pain     Note: This dictation was prepared with Dragon dictation. Any transcriptional errors that result from this process are unintentional     Steven Pear, MD 05/14/20 1918

## 2020-05-14 NOTE — ED Triage Notes (Signed)
Pt via POV from home c/o lower abd pain and urinary frequency for the past 2 days. Denies any abd surgeries. - NVD. Pt is A&Ox4 and NAD.

## 2020-05-14 NOTE — Discharge Instructions (Signed)
Please seek medical attention for any high fevers, chest pain, shortness of breath, change in behavior, persistent vomiting, bloody stool or any other new or concerning symptoms.
